# Patient Record
Sex: Male | Born: 1955
Health system: Southern US, Community
[De-identification: ages and names within clinical notes are randomized; demographics above are authoritative.]

## PROBLEM LIST (undated history)

## (undated) DIAGNOSIS — C61 Malignant neoplasm of prostate: Secondary | ICD-10-CM

## (undated) DIAGNOSIS — K219 Gastro-esophageal reflux disease without esophagitis: Secondary | ICD-10-CM

## (undated) DIAGNOSIS — E7800 Pure hypercholesterolemia, unspecified: Secondary | ICD-10-CM

## (undated) DIAGNOSIS — F419 Anxiety disorder, unspecified: Secondary | ICD-10-CM

## (undated) DIAGNOSIS — I4510 Unspecified right bundle-branch block: Secondary | ICD-10-CM

## (undated) DIAGNOSIS — F32A Depression, unspecified: Secondary | ICD-10-CM

## (undated) DIAGNOSIS — F329 Major depressive disorder, single episode, unspecified: Secondary | ICD-10-CM

## (undated) DIAGNOSIS — R7303 Prediabetes: Secondary | ICD-10-CM

## (undated) HISTORY — PX: OTHER SURGICAL HISTORY: SHX169

## (undated) HISTORY — PX: TONSILLECTOMY: SUR1361

## (undated) HISTORY — PX: COLONOSCOPY: SHX174

## (undated) HISTORY — PX: KNEE SURGERY: SHX244

## (undated) HISTORY — PX: APPENDECTOMY: SHX54

---

## 2002-05-21 ENCOUNTER — Ambulatory Visit (HOSPITAL_COMMUNITY): Admission: RE | Admit: 2002-05-21 | Discharge: 2002-05-21 | Payer: Self-pay | Admitting: Gastroenterology

## 2004-03-09 ENCOUNTER — Encounter: Admission: RE | Admit: 2004-03-09 | Discharge: 2004-03-09 | Payer: Self-pay | Admitting: Internal Medicine

## 2010-11-08 ENCOUNTER — Encounter: Payer: Self-pay | Admitting: Otolaryngology

## 2014-03-12 ENCOUNTER — Encounter (HOSPITAL_COMMUNITY): Payer: Self-pay

## 2014-07-23 ENCOUNTER — Other Ambulatory Visit: Payer: Self-pay | Admitting: Internal Medicine

## 2014-07-23 ENCOUNTER — Ambulatory Visit
Admission: RE | Admit: 2014-07-23 | Discharge: 2014-07-23 | Disposition: A | Discharge status: Home / Self Care | Payer: 59 | Source: Ambulatory Visit | Attending: Internal Medicine | Admitting: Internal Medicine

## 2014-07-23 DIAGNOSIS — R51 Headache: Principal | ICD-10-CM

## 2014-07-23 DIAGNOSIS — R519 Headache, unspecified: Secondary | ICD-10-CM

## 2016-04-12 DIAGNOSIS — R7309 Other abnormal glucose: Secondary | ICD-10-CM | POA: Diagnosis not present

## 2016-04-12 DIAGNOSIS — F322 Major depressive disorder, single episode, severe without psychotic features: Secondary | ICD-10-CM | POA: Diagnosis not present

## 2016-04-12 DIAGNOSIS — Z0000 Encounter for general adult medical examination without abnormal findings: Secondary | ICD-10-CM | POA: Diagnosis not present

## 2016-04-12 DIAGNOSIS — Z125 Encounter for screening for malignant neoplasm of prostate: Secondary | ICD-10-CM | POA: Diagnosis not present

## 2016-04-12 DIAGNOSIS — E7800 Pure hypercholesterolemia, unspecified: Secondary | ICD-10-CM | POA: Diagnosis not present

## 2016-04-26 DIAGNOSIS — R972 Elevated prostate specific antigen [PSA]: Secondary | ICD-10-CM | POA: Diagnosis not present

## 2017-04-13 DIAGNOSIS — R5383 Other fatigue: Secondary | ICD-10-CM | POA: Diagnosis not present

## 2017-04-13 DIAGNOSIS — F419 Anxiety disorder, unspecified: Secondary | ICD-10-CM | POA: Diagnosis not present

## 2017-04-13 DIAGNOSIS — Z125 Encounter for screening for malignant neoplasm of prostate: Secondary | ICD-10-CM | POA: Diagnosis not present

## 2017-04-13 DIAGNOSIS — R7309 Other abnormal glucose: Secondary | ICD-10-CM | POA: Diagnosis not present

## 2017-04-13 DIAGNOSIS — Z1159 Encounter for screening for other viral diseases: Secondary | ICD-10-CM | POA: Diagnosis not present

## 2017-04-13 DIAGNOSIS — E7800 Pure hypercholesterolemia, unspecified: Secondary | ICD-10-CM | POA: Diagnosis not present

## 2017-04-13 DIAGNOSIS — N529 Male erectile dysfunction, unspecified: Secondary | ICD-10-CM | POA: Diagnosis not present

## 2017-04-13 DIAGNOSIS — W57XXXA Bitten or stung by nonvenomous insect and other nonvenomous arthropods, initial encounter: Secondary | ICD-10-CM | POA: Diagnosis not present

## 2017-04-13 DIAGNOSIS — Z0000 Encounter for general adult medical examination without abnormal findings: Secondary | ICD-10-CM | POA: Diagnosis not present

## 2017-10-21 DIAGNOSIS — N529 Male erectile dysfunction, unspecified: Secondary | ICD-10-CM | POA: Diagnosis not present

## 2017-10-21 DIAGNOSIS — E7800 Pure hypercholesterolemia, unspecified: Secondary | ICD-10-CM | POA: Diagnosis not present

## 2017-10-21 DIAGNOSIS — F419 Anxiety disorder, unspecified: Secondary | ICD-10-CM | POA: Diagnosis not present

## 2017-10-21 DIAGNOSIS — R7309 Other abnormal glucose: Secondary | ICD-10-CM | POA: Diagnosis not present

## 2017-10-21 DIAGNOSIS — Z23 Encounter for immunization: Secondary | ICD-10-CM | POA: Diagnosis not present

## 2017-10-24 DIAGNOSIS — N400 Enlarged prostate without lower urinary tract symptoms: Secondary | ICD-10-CM | POA: Diagnosis not present

## 2017-10-24 DIAGNOSIS — E782 Mixed hyperlipidemia: Secondary | ICD-10-CM | POA: Diagnosis not present

## 2017-10-24 DIAGNOSIS — R7303 Prediabetes: Secondary | ICD-10-CM | POA: Diagnosis not present

## 2017-10-24 DIAGNOSIS — N529 Male erectile dysfunction, unspecified: Secondary | ICD-10-CM | POA: Diagnosis not present

## 2017-10-24 DIAGNOSIS — R972 Elevated prostate specific antigen [PSA]: Secondary | ICD-10-CM | POA: Diagnosis not present

## 2017-11-11 DIAGNOSIS — R972 Elevated prostate specific antigen [PSA]: Secondary | ICD-10-CM | POA: Diagnosis not present

## 2017-11-11 DIAGNOSIS — N5201 Erectile dysfunction due to arterial insufficiency: Secondary | ICD-10-CM | POA: Diagnosis not present

## 2017-12-16 HISTORY — PX: PROSTATE BIOPSY: SHX241

## 2017-12-19 DIAGNOSIS — R972 Elevated prostate specific antigen [PSA]: Secondary | ICD-10-CM | POA: Diagnosis not present

## 2018-01-02 ENCOUNTER — Encounter: Payer: Self-pay | Admitting: Radiation Oncology

## 2018-01-02 DIAGNOSIS — C61 Malignant neoplasm of prostate: Secondary | ICD-10-CM | POA: Diagnosis not present

## 2018-01-09 DIAGNOSIS — K429 Umbilical hernia without obstruction or gangrene: Secondary | ICD-10-CM | POA: Diagnosis not present

## 2018-01-09 DIAGNOSIS — C61 Malignant neoplasm of prostate: Secondary | ICD-10-CM | POA: Diagnosis not present

## 2018-01-30 ENCOUNTER — Ambulatory Visit: Payer: Self-pay

## 2018-01-30 ENCOUNTER — Ambulatory Visit: Payer: Self-pay | Admitting: Radiation Oncology

## 2018-02-02 DIAGNOSIS — C61 Malignant neoplasm of prostate: Secondary | ICD-10-CM | POA: Diagnosis not present

## 2018-02-02 DIAGNOSIS — R972 Elevated prostate specific antigen [PSA]: Secondary | ICD-10-CM | POA: Diagnosis not present

## 2018-02-14 ENCOUNTER — Encounter: Payer: Self-pay | Admitting: Radiation Oncology

## 2018-02-14 NOTE — Progress Notes (Signed)
GU Location of Tumor / Histology: prostatic adenocarcinoma  If Prostate Cancer, Gleason Score is (3 + 4) and PSA is (10) as of January 2019. Prostate volume: 38.01 grams. Reports the first time he was told his PSA was elevated was April 2018.  Kaleen Odea was referred to Dr. Lovena Neighbours by his PCP, PA Rolland Porter, in January 2019 for further evaluation of an elevated PSA.   Biopsies of prostate (if applicable) revealed:    Past/Anticipated interventions by urology, if any: prostate biopsy, discussed tx options, referred to radiation oncology  Past/Anticipated interventions by medical oncology, if any: no  Weight changes, if any: no  Bowel/Bladder complaints, if any: IPSS 11. Reports occasional frequency and urgency, nocturia x 1. Reports burning at the tip of his penis in the past. Denies dysuria, hematuria, leakage or incontinence.  Nausea/Vomiting, if any: no  Pain issues, if any:  no  SAFETY ISSUES:  Prior radiation? no  Pacemaker/ICD? no  Possible current pregnancy? no  Is the patient on methotrexate? no  Current Complaints / other details:  62 year old male. Married. NKDA. Father and paternal uncle with hx of unknown cancer. Resides in [place] by [employer]. Married for 40 plus years. No children just "furbabies.'

## 2018-02-16 ENCOUNTER — Telehealth: Payer: Self-pay | Admitting: Medical Oncology

## 2018-02-16 ENCOUNTER — Encounter: Payer: Self-pay | Admitting: Radiation Oncology

## 2018-02-16 ENCOUNTER — Encounter: Payer: Self-pay | Admitting: General Practice

## 2018-02-16 ENCOUNTER — Ambulatory Visit
Admission: RE | Admit: 2018-02-16 | Discharge: 2018-02-16 | Disposition: A | Discharge status: Home / Self Care | Payer: BLUE CROSS/BLUE SHIELD | Source: Ambulatory Visit | Attending: Radiation Oncology | Admitting: Radiation Oncology

## 2018-02-16 ENCOUNTER — Ambulatory Visit: Payer: BLUE CROSS/BLUE SHIELD | Admitting: Radiation Oncology

## 2018-02-16 ENCOUNTER — Other Ambulatory Visit: Payer: Self-pay

## 2018-02-16 VITALS — BP 135/97 | HR 69 | Temp 98.2°F | Resp 18 | Wt 255.6 lb

## 2018-02-16 DIAGNOSIS — R972 Elevated prostate specific antigen [PSA]: Secondary | ICD-10-CM | POA: Diagnosis not present

## 2018-02-16 DIAGNOSIS — K219 Gastro-esophageal reflux disease without esophagitis: Secondary | ICD-10-CM | POA: Diagnosis not present

## 2018-02-16 DIAGNOSIS — F418 Other specified anxiety disorders: Secondary | ICD-10-CM | POA: Diagnosis not present

## 2018-02-16 DIAGNOSIS — E7800 Pure hypercholesterolemia, unspecified: Secondary | ICD-10-CM | POA: Insufficient documentation

## 2018-02-16 DIAGNOSIS — C61 Malignant neoplasm of prostate: Secondary | ICD-10-CM | POA: Diagnosis not present

## 2018-02-16 DIAGNOSIS — Z809 Family history of malignant neoplasm, unspecified: Secondary | ICD-10-CM | POA: Insufficient documentation

## 2018-02-16 HISTORY — DX: Major depressive disorder, single episode, unspecified: F32.9

## 2018-02-16 HISTORY — DX: Gastro-esophageal reflux disease without esophagitis: K21.9

## 2018-02-16 HISTORY — DX: Malignant neoplasm of prostate: C61

## 2018-02-16 HISTORY — DX: Anxiety disorder, unspecified: F41.9

## 2018-02-16 HISTORY — DX: Pure hypercholesterolemia, unspecified: E78.00

## 2018-02-16 HISTORY — DX: Depression, unspecified: F32.A

## 2018-02-16 NOTE — Progress Notes (Signed)
See progress note under physician encounter. 

## 2018-02-16 NOTE — Progress Notes (Signed)
[facility] Psychosocial Distress Screening Clinical Social Work  Clinical Social Work was referred by distress screening protocol.  The patient scored a 7 on the Psychosocial Distress Thermometer which indicates moderate distress. Clinical Social Worker Edwyna Shell to assess for distress and other psychosocial needs. CSW and patient discussed common feeling and emotions when being diagnosed with cancer, and the importance of support during treatment. CSW informed patient of the support team and support services at [facility]. CSW provided contact information and encouraged patient to call with any questions or concerns.  Is pleased w finding out that cancer is "controllable, treatable."  "I am quite fortunate."  Had significant anxiety initially.  Would like to speak w dietician, staff message sent to [facility].    ONCBCN DISTRESS SCREENING 02/16/2018  Screening Type Initial Screening  Distress experienced in past week (1-10) 7  Practical problem type Insurance;Work/school  Emotional problem type Depression;Nervousness/Anxiety;Adjusting to illness  Information Concerns Type Lack of info about treatment  Physical Problem type Tingling hands/feet;Skin dry/itchy  Physician notified of physical symptoms Yes  Referral to clinical psychology No  Referral to clinical social work Yes  Referral to dietition No  Referral to financial advocate No  Referral to support programs Yes  Referral to palliative care No  Other can be reached on his cell at [phone]    Clinical Social Worker follow up needed: No.  If yes, follow up plan:  Edwyna Shell, LCSW Clinical Social Worker Phone:  [phone]

## 2018-02-16 NOTE — Progress Notes (Signed)
Radiation Oncology         [phone]) [phone] ________________________________  Initial outpatient Consultation  Name: Randy Alvarez MRN: [id number]  Date: 02/16/2018  DOB: [birth year]  MD:YJWLKHV, Loma Sousa, PA-C  Winter, Christopher Aar*   REFERRING PHYSICIAN: Davis Gourd*  DIAGNOSIS: 62 y.o. gentleman with Stage T1c adenocarcinoma of the prostate with Gleason Score of 3+4, and PSA of 10    ICD-10-CM   1. Malignant neoplasm of prostate [facility]) [facility] referral to Social Work    HISTORY OF PRESENT ILLNESS: Randy Alvarez is a 62 y.o. male with a diagnosis of prostate cancer. He was noted to have an elevated PSA of 10.0 by his primary care provider, Carlyon Prows, PA-C.  Previous PSA had been elevated at 7.7 in 03/2017 but he had not had elevated PSA prior to that to his knowledge.  Accordingly, he was referred for evaluation in urology by Dr. Lovena Neighbours on 11/11/17,  digital rectal examination was performed at that time revealing symmetric lobes without discrete nodularity.  The patient proceeded to transrectal ultrasound with 12 biopsies of the prostate on 12/19/17.  The prostate volume measured 38.01 cc.  Out of 12 core biopsies,3 were positive.  The maximum Gleason score was 3+4, and this was seen in right mid lateral.  Additionally, there was Gleason 3+3 disease in the right mid and left base lateral.  A CT abdomen and pelvis was performed on 01/09/2018 for disease staging and was negative for any evidence of metastatic disease in the abdomen or pelvis.  The patient reviewed the biopsy results with his urologist and he has kindly been referred today for discussion of potential radiation treatment options.   PREVIOUS RADIATION THERAPY: No  PAST MEDICAL HISTORY:  Past Medical History:  Diagnosis Date  . Anxiety   . Depression   . GERD (gastroesophageal reflux disease)   . Hypercholesteremia   . Prostate cancer ([place])       PAST SURGICAL HISTORY: Past Surgical History:    Procedure Laterality Date  . APPENDECTOMY    . descended testicle     . KNEE SURGERY Right   . nose operation    . nose operation    . PROSTATE BIOPSY      FAMILY HISTORY:  Family History  Problem Relation Age of Onset  . Cancer Father   . Cancer Paternal Uncle     SOCIAL HISTORY:  Social History   Socioeconomic History  . Marital status: Married    Spouse name: Not on file  . Number of children: Not on file  . Years of education: Not on file  . Highest education level: Not on file  Occupational History  . Not on file  Social Needs  . Financial resource strain: Not on file  . Food insecurity:    Worry: Not on file    Inability: Not on file  . Transportation needs:    Medical: Not on file    Non-medical: Not on file  Tobacco Use  . Smoking status: Never Smoker  . Smokeless tobacco: Never Used  Substance and Sexual Activity  . Alcohol use: Yes  . Drug use: Never  . Sexual activity: Yes  Lifestyle  . Physical activity:    Days per week: Not on file    Minutes per session: Not on file  . Stress: Not on file  Relationships  . Social connections:    Talks on phone: Not on file    Gets together: Not on file  Attends religious service: Not on file    Active member of club or organization: Not on file    Attends meetings of clubs or organizations: Not on file    Relationship status: Not on file  . Intimate partner violence:    Fear of current or ex partner: Not on file    Emotionally abused: Not on file    Physically abused: Not on file    Forced sexual activity: Not on file  Other Topics Concern  . Not on file  Social History Narrative  . Not on file    ALLERGIES: Patient has no known allergies.  MEDICATIONS:  Current Outpatient Medications  Medication Sig Dispense Refill  . sildenafil (REVATIO) 20 MG tablet Take 20 mg by mouth 3 (three) times daily.    Marland Kitchen aspirin EC 81 MG tablet Take 81 mg by mouth daily.     No current facility-administered  medications for this encounter.     REVIEW OF SYSTEMS:  On review of systems, the patient reports that he is doing well overall.  He denies any chest pain, shortness of breath, cough, fevers, chills, night sweats, unintended weight changes.  He denies any bowel disturbances, and denies abdominal pain, nausea or vomiting.  He denies any new musculoskeletal or joint aches or pains. His IPSS was 11, indicating mild to moderate urinary symptoms. He is able to complete sexual activity with most attempts. A complete review of systems is obtained and is otherwise negative.    PHYSICAL EXAM:  Wt Readings from Last 3 Encounters:  02/16/18 255 lb 9.6 oz (115.9 kg)   Temp Readings from Last 3 Encounters:  02/16/18 98.2 F (36.8 C) (Oral)   BP Readings from Last 3 Encounters:  02/16/18 (!) 135/97   Pulse Readings from Last 3 Encounters:  02/16/18 69   Pain Assessment Pain Score: 0-No pain/10  In general this is a well appearing Caucasian male in no acute distress.  He is alert and oriented x4 and appropriate throughout the examination. HEENT reveals that the patient is normocephalic, atraumatic. EOMs are intact. PERRLA. Skin is intact without any evidence of gross lesions. Cardiovascular exam reveals a regular rate and rhythm, no clicks rubs or murmurs are auscultated. Chest is clear to auscultation bilaterally. Lymphatic assessment is performed and does not reveal any adenopathy in the cervical, supraclavicular, axillary, or inguinal chains. Abdomen has active bowel sounds in all quadrants and is intact. The abdomen is soft, non tender, non distended. Lower extremities are negative for pretibial pitting edema, deep calf tenderness, cyanosis or clubbing.   KPS = 100  100 - Normal; no complaints; no evidence of disease. 90   - Able to carry on normal activity; minor signs or symptoms of disease. 80   - Normal activity with effort; some signs or symptoms of disease. 75   - Cares for self; unable to  carry on normal activity or to do active work. 60   - Requires occasional assistance, but is able to care for most of his personal needs. 50   - Requires considerable assistance and frequent medical care. 42   - Disabled; requires special care and assistance. 22   - Severely disabled; hospital admission is indicated although death not imminent. 76   - Very sick; hospital admission necessary; active supportive treatment necessary. 10   - Moribund; fatal processes progressing rapidly. 0     - Dead  Karnofsky DA, Abelmann WH, Craver LS and Burchenal [facility] [phone]) The use of the  nitrogen mustards in the palliative treatment of carcinoma: with particular reference to bronchogenic carcinoma Cancer 1 [phone]  LABORATORY DATA:  No results found for: WBC, HGB, HCT, MCV, PLT No results found for: NA, K, CL, CO2 No results found for: ALT, AST, GGT, ALKPHOS, BILITOT   RADIOGRAPHY: No results found.    IMPRESSION/PLAN: 1. 62 y.o. gentleman with Stage T1c adenocarcinoma of the prostate with Gleason Score of 3+4, and PSA of 10. We discussed the patient's workup and outlined the nature of prostate cancer in this setting. The patient's T stage, Gleason's score, and PSA put him into the favorable intermediate risk group. Accordingly, he is eligible for a variety of potential treatment options including brachytherapy or 5-8 weeks of external beam radiation. We discussed the available radiation techniques, and focused on the details and logistics and delivery.  We discussed and outlined the risks, benefits, short and long-term effects associated with radiotherapy and compared and contrasted these with prostatectomy. We also discussed the role of SpaceOAR in reducing the rectal toxicity associated with radiotherapy.   At the conclusion of our conversation, the patient is interested in moving forward with brachytherapy and use of SpaceOAR to reduce rectal toxicity from radiotherapy.  He would like to have his treatment  in mid-June or after to allow for a summer vacation around [place].  We will share our discussion with Dr. Lovena Neighbours and move forward with scheduling his CT [facility] planning appointment in the near future.  The patient met briefly with Romie Jumper in our office who will be working closely with him to coordinate OR scheduling and pre and post procedure appointments.  We will contact the pharmaceutical rep to ensure that [place] is available at the time of procedure.  He will have a prostate MRI following his post-seed CT SIM to confirm appropriate distribution of the [place].   Nicholos Johns, PA-C    Tyler Pita, MD  [facility] Direct Dial: [phone]  Fax: [phone] [email or web address]  Skype  LinkedIn

## 2018-02-16 NOTE — Telephone Encounter (Signed)
Called Randy Alvarez to introduced myself as the prostate nurse navigator and my role. He consulted with Dr. Tammi Klippel this morning and I was unable to meet him. He states the visit went very well and he has decided on brachytherapy for treatment. He did meet with Enid Derry and is aware she will be in contact to schedule. He currently does not have questions but I did give him my office number and asked him to call with questions or concerns. He voiced understanding.

## 2018-02-20 ENCOUNTER — Telehealth: Payer: Self-pay | Admitting: Medical Oncology

## 2018-02-20 ENCOUNTER — Other Ambulatory Visit: Payer: Self-pay | Admitting: Medical Oncology

## 2018-02-20 DIAGNOSIS — C61 Malignant neoplasm of prostate: Secondary | ICD-10-CM

## 2018-02-20 NOTE — Telephone Encounter (Signed)
Per Edwyna Shell, SW, Mr. Randy Alvarez is interested in meeting with dietician to lose weight and eat healthier. Per Dory Peru he needs to be referred to [facility]. I called Mr. Randy Alvarez to confirm this is his request. He states that he has struggled with losing weight and feels this referral will help to motivate him. Referral placed.

## 2018-02-21 ENCOUNTER — Telehealth: Payer: Self-pay | Admitting: *Deleted

## 2018-02-21 NOTE — Telephone Encounter (Signed)
CALLED PATIENT TO INFORM OF PRE-SEED PLANNING CT FOR 03-03-18 - ARRIVAL TIME - 1:15 PM, SPOKE WITH PATIENT AND HE IS AWARE OF THIS APPT.

## 2018-02-24 DIAGNOSIS — C61 Malignant neoplasm of prostate: Secondary | ICD-10-CM | POA: Diagnosis not present

## 2018-03-01 ENCOUNTER — Institutional Professional Consult (permissible substitution): Payer: Self-pay | Admitting: Radiation Oncology

## 2018-03-02 ENCOUNTER — Telehealth: Payer: Self-pay | Admitting: *Deleted

## 2018-03-02 NOTE — Telephone Encounter (Signed)
CALLED PATIENT TO REMIND OF PRE-SEED APPTS. FOR 03-03-18, LVM FOR A RETURN CALL 

## 2018-03-03 ENCOUNTER — Ambulatory Visit
Admission: RE | Admit: 2018-03-03 | Discharge: 2018-03-03 | Disposition: A | Discharge status: Home / Self Care | Payer: BLUE CROSS/BLUE SHIELD | Source: Ambulatory Visit | Attending: Radiation Oncology | Admitting: Radiation Oncology

## 2018-03-03 ENCOUNTER — Other Ambulatory Visit: Payer: Self-pay | Admitting: Urology

## 2018-03-03 DIAGNOSIS — C61 Malignant neoplasm of prostate: Secondary | ICD-10-CM

## 2018-03-03 DIAGNOSIS — Z510 Encounter for antineoplastic radiation therapy: Secondary | ICD-10-CM | POA: Insufficient documentation

## 2018-03-03 NOTE — Progress Notes (Signed)
  Radiation Oncology         [phone]) [phone] ________________________________  Name: Randy Alvarez MRN: [id number]  Date: 03/03/2018  DOB: 06-Sep-1956  SIMULATION AND TREATMENT PLANNING NOTE PUBIC ARCH STUDY  FB:XUXYBFX, Myrtha Mantis  Winter, Christopher Aar*  DIAGNOSIS: 62 y.o. male with Stage T1c adenocarcinoma of the prostate with Gleason Score of 3+4, and PSA of 10     ICD-10-CM   1. Malignant neoplasm of prostate ([place]) C61     COMPLEX SIMULATION:  The patient presented today for evaluation for possible prostate seed implant. He was brought to the radiation planning suite and placed supine on the CT couch. A 3-dimensional image study set was obtained in upload to the planning computer. There, on each axial slice, I contoured the prostate gland. Then, using three-dimensional radiation planning tools I reconstructed the prostate in view of the structures from the transperineal needle pathway to assess for possible pubic arch interference. In doing so, I did not appreciate any pubic arch interference. Also, the patient's prostate volume was estimated based on the drawn structure. The volume was 46 cc.  Given the pubic arch appearance and prostate volume, patient remains a good candidate to proceed with prostate seed implant. Today, he freely provided informed written consent to proceed.    PLAN: The patient will undergo prostate seed implant.   ________________________________  Sheral Apley. Tammi Klippel, M.D.  This document serves as a record of services personally performed by Tyler Pita, MD. It was created on his behalf by Rae Lips, a trained medical scribe. The creation of this record is based on the scribe's personal observations and the provider's statements to them. This document has been checked and approved by the attending provider.

## 2018-03-06 ENCOUNTER — Encounter (HOSPITAL_COMMUNITY)
Admission: RE | Admit: 2018-03-06 | Discharge: 2018-03-06 | Disposition: A | Discharge status: Home / Self Care | Payer: BLUE CROSS/BLUE SHIELD | Source: Ambulatory Visit | Attending: Urology | Admitting: Urology

## 2018-03-06 ENCOUNTER — Ambulatory Visit (HOSPITAL_COMMUNITY)
Admission: RE | Admit: 2018-03-06 | Discharge: 2018-03-06 | Disposition: A | Discharge status: Home / Self Care | Payer: BLUE CROSS/BLUE SHIELD | Source: Ambulatory Visit | Attending: Urology | Admitting: Urology

## 2018-03-06 DIAGNOSIS — I10 Essential (primary) hypertension: Secondary | ICD-10-CM | POA: Diagnosis not present

## 2018-03-06 DIAGNOSIS — Z01818 Encounter for other preprocedural examination: Secondary | ICD-10-CM

## 2018-03-06 DIAGNOSIS — I4510 Unspecified right bundle-branch block: Secondary | ICD-10-CM | POA: Diagnosis not present

## 2018-03-06 DIAGNOSIS — I493 Ventricular premature depolarization: Secondary | ICD-10-CM | POA: Insufficient documentation

## 2018-03-06 DIAGNOSIS — Z01810 Encounter for preprocedural cardiovascular examination: Secondary | ICD-10-CM | POA: Insufficient documentation

## 2018-03-17 ENCOUNTER — Ambulatory Visit: Payer: BLUE CROSS/BLUE SHIELD | Admitting: Dietician

## 2018-03-17 ENCOUNTER — Other Ambulatory Visit: Payer: Self-pay | Admitting: Urology

## 2018-03-17 DIAGNOSIS — C61 Malignant neoplasm of prostate: Secondary | ICD-10-CM

## 2018-03-23 DIAGNOSIS — L723 Sebaceous cyst: Secondary | ICD-10-CM | POA: Diagnosis not present

## 2018-04-06 ENCOUNTER — Telehealth: Payer: Self-pay | Admitting: *Deleted

## 2018-04-06 NOTE — Telephone Encounter (Signed)
Called patient to remind of lab appt. For 04-07-18- arrival time- 1:45 pm @ [facility] Admitting, spoke with patient and he is aware of this appt.

## 2018-04-07 ENCOUNTER — Encounter (HOSPITAL_COMMUNITY)
Admission: RE | Admit: 2018-04-07 | Discharge: 2018-04-07 | Disposition: A | Discharge status: Home / Self Care | Payer: BLUE CROSS/BLUE SHIELD | Source: Ambulatory Visit | Attending: Urology | Admitting: Urology

## 2018-04-07 DIAGNOSIS — Z01812 Encounter for preprocedural laboratory examination: Secondary | ICD-10-CM | POA: Diagnosis not present

## 2018-04-07 LAB — COMPREHENSIVE METABOLIC PANEL
ALT: 32 U/L (ref 17–63)
AST: 31 U/L (ref 15–41)
Albumin: 3.7 g/dL (ref 3.5–5.0)
Alkaline Phosphatase: 54 U/L (ref 38–126)
Anion gap: 8 (ref 5–15)
BUN: 16 mg/dL (ref 6–20)
CO2: 25 mmol/L (ref 22–32)
Calcium: 9.2 mg/dL (ref 8.9–10.3)
Chloride: 106 mmol/L (ref 101–111)
Creatinine, Ser: 1.05 mg/dL (ref 0.61–1.24)
GFR calc Af Amer: >60 mL/min (ref >60)
GFR calc non Af Amer: >60 mL/min (ref >60)
Glucose, Bld: 117 mg/dL — ABNORMAL HIGH (ref 65–99)
Potassium: 4.3 mmol/L (ref 3.5–5.1)
Sodium: 139 mmol/L (ref 135–145)
Total Bilirubin: 0.9 mg/dL (ref 0.3–1.2)
Total Protein: 7.1 g/dL (ref 6.5–8.1)

## 2018-04-07 LAB — CBC
HCT: 44.4 % (ref 39.0–52.0)
Hemoglobin: 15 g/dL (ref 13.0–17.0)
MCH: 31.1 pg (ref 26.0–34.0)
MCHC: 33.8 g/dL (ref 30.0–36.0)
MCV: 91.9 fL (ref 78.0–100.0)
Platelets: 204 10*3/uL (ref 150–400)
RBC: 4.83 MIL/uL (ref 4.22–5.81)
RDW: 13.1 % (ref 11.5–15.5)
WBC: 7.2 10*3/uL (ref 4.0–10.5)

## 2018-04-07 LAB — APTT: aPTT: 27 seconds (ref 24–36)

## 2018-04-07 LAB — PROTIME-INR
INR: 1.1
Prothrombin Time: 14.1 seconds (ref 11.4–15.2)

## 2018-04-10 ENCOUNTER — Other Ambulatory Visit: Payer: Self-pay

## 2018-04-10 ENCOUNTER — Encounter (HOSPITAL_BASED_OUTPATIENT_CLINIC_OR_DEPARTMENT_OTHER): Payer: Self-pay

## 2018-04-10 NOTE — Progress Notes (Signed)
Spoke with:  Legrand Como NPO:  No food after midnight/Clear liquids until 7:00AM DOS Arrival time: 11:00AM Labs:  (EKG/CXR 03/06/2018, CBC, CMP, PT, PTT 04/07/2018 in epic) AM medications:  Fleet enema Pre op orders: yes Ride home:  Mardene Celeste (wife) [phone]

## 2018-04-10 NOTE — Pre-Procedure Instructions (Signed)
CMP results 04/07/2018 faxed to Dr. Lovena Neighbours via epic.

## 2018-04-13 ENCOUNTER — Telehealth: Payer: Self-pay | Admitting: *Deleted

## 2018-04-13 NOTE — Telephone Encounter (Signed)
CALLED PATIENT TO REMIND OF PROCEDURE FOR 04-14-18, SPOKE WITH PATIENT AND HE IS AWARE OF THIS PROCEDURE

## 2018-04-14 ENCOUNTER — Encounter (HOSPITAL_BASED_OUTPATIENT_CLINIC_OR_DEPARTMENT_OTHER): Payer: Self-pay | Admitting: Anesthesiology

## 2018-04-14 ENCOUNTER — Encounter (HOSPITAL_BASED_OUTPATIENT_CLINIC_OR_DEPARTMENT_OTHER)
Admission: RE | Disposition: A | Discharge status: Home / Self Care | Payer: Self-pay | Source: Ambulatory Visit | Attending: Urology

## 2018-04-14 ENCOUNTER — Ambulatory Visit (HOSPITAL_BASED_OUTPATIENT_CLINIC_OR_DEPARTMENT_OTHER): Payer: BLUE CROSS/BLUE SHIELD | Admitting: Anesthesiology

## 2018-04-14 ENCOUNTER — Ambulatory Visit (HOSPITAL_COMMUNITY): Payer: BLUE CROSS/BLUE SHIELD

## 2018-04-14 ENCOUNTER — Other Ambulatory Visit: Payer: Self-pay

## 2018-04-14 ENCOUNTER — Ambulatory Visit (HOSPITAL_BASED_OUTPATIENT_CLINIC_OR_DEPARTMENT_OTHER)
Admission: RE | Admit: 2018-04-14 | Discharge: 2018-04-14 | Disposition: A | Discharge status: Home / Self Care | Payer: BLUE CROSS/BLUE SHIELD | Source: Ambulatory Visit | Attending: Urology | Admitting: Urology

## 2018-04-14 DIAGNOSIS — C61 Malignant neoplasm of prostate: Secondary | ICD-10-CM | POA: Diagnosis not present

## 2018-04-14 DIAGNOSIS — Z885 Allergy status to narcotic agent status: Secondary | ICD-10-CM | POA: Insufficient documentation

## 2018-04-14 DIAGNOSIS — Z7982 Long term (current) use of aspirin: Secondary | ICD-10-CM | POA: Insufficient documentation

## 2018-04-14 DIAGNOSIS — Z888 Allergy status to other drugs, medicaments and biological substances status: Secondary | ICD-10-CM | POA: Insufficient documentation

## 2018-04-14 DIAGNOSIS — Z79899 Other long term (current) drug therapy: Secondary | ICD-10-CM | POA: Diagnosis not present

## 2018-04-14 HISTORY — DX: Unspecified right bundle-branch block: I45.10

## 2018-04-14 HISTORY — DX: Prediabetes: R73.03

## 2018-04-14 HISTORY — PX: RADIOACTIVE SEED IMPLANT: SHX5150

## 2018-04-14 HISTORY — PX: SPACE OAR INSTILLATION: SHX6769

## 2018-04-14 LAB — POCT I-STAT 4, (NA,K, GLUC, HGB,HCT)
Glucose, Bld: 124 mg/dL — ABNORMAL HIGH (ref 70–99)
HCT: 46 % (ref 39.0–52.0)
Hemoglobin: 15.6 g/dL (ref 13.0–17.0)
Potassium: 4.2 mmol/L (ref 3.5–5.1)
Sodium: 140 mmol/L (ref 135–145)

## 2018-04-14 SURGERY — INSERTION, RADIATION SOURCE, PROSTATE
Anesthesia: General | Site: Prostate

## 2018-04-14 MED ORDER — ACETAMINOPHEN 325 MG PO TABS
ORAL_TABLET | ORAL | Status: DC | PRN
  Administered 2018-04-14: 1000 mg via ORAL

## 2018-04-14 MED ORDER — PHENAZOPYRIDINE HCL 200 MG PO TABS
200.0000 mg | ORAL_TABLET | Freq: Once | ORAL | Status: AC
  Administered 2018-04-14: 200 mg via ORAL
  Filled 2018-04-14: qty 1

## 2018-04-14 MED ORDER — EPHEDRINE SULFATE-NACL 50-0.9 MG/10ML-% IV SOSY
PREFILLED_SYRINGE | INTRAVENOUS | Status: DC | PRN
  Administered 2018-04-14 (×4): 10 mg via INTRAVENOUS

## 2018-04-14 MED ORDER — PROPOFOL 10 MG/ML IV BOLUS
INTRAVENOUS | Status: AC
  Filled 2018-04-14: qty 40

## 2018-04-14 MED ORDER — DEXAMETHASONE SODIUM PHOSPHATE 10 MG/ML IJ SOLN
INTRAMUSCULAR | Status: AC
  Filled 2018-04-14: qty 1

## 2018-04-14 MED ORDER — LIDOCAINE 2% (20 MG/ML) 5 ML SYRINGE
INTRAMUSCULAR | Status: AC
  Filled 2018-04-14: qty 5

## 2018-04-14 MED ORDER — SODIUM CHLORIDE 0.9 % IJ SOLN
INTRAMUSCULAR | Status: DC | PRN
  Administered 2018-04-14: 10 mL

## 2018-04-14 MED ORDER — HYDROMORPHONE HCL 1 MG/ML IJ SOLN
0.2500 mg | INTRAMUSCULAR | Status: DC | PRN
  Filled 2018-04-14: qty 0.5

## 2018-04-14 MED ORDER — FENTANYL CITRATE (PF) 100 MCG/2ML IJ SOLN
INTRAMUSCULAR | Status: DC | PRN
  Administered 2018-04-14: 25 ug via INTRAVENOUS
  Administered 2018-04-14: 50 ug via INTRAVENOUS
  Administered 2018-04-14: 25 ug via INTRAVENOUS

## 2018-04-14 MED ORDER — FENTANYL CITRATE (PF) 100 MCG/2ML IJ SOLN
INTRAMUSCULAR | Status: AC
  Filled 2018-04-14: qty 2

## 2018-04-14 MED ORDER — LACTATED RINGERS IV SOLN
INTRAVENOUS | Status: DC
  Administered 2018-04-14 (×3): via INTRAVENOUS
  Filled 2018-04-14: qty 1000

## 2018-04-14 MED ORDER — PHENAZOPYRIDINE HCL 200 MG PO TABS: 200.0000 mg | ORAL_TABLET | Freq: Three times a day (TID) | ORAL | 0 refills | Status: AC | PRN

## 2018-04-14 MED ORDER — PHENAZOPYRIDINE HCL 100 MG PO TABS
ORAL_TABLET | ORAL | Status: AC
Start: 2018-04-14 — End: ?
  Filled 2018-04-14: qty 2

## 2018-04-14 MED ORDER — PROMETHAZINE HCL 25 MG/ML IJ SOLN
6.2500 mg | INTRAMUSCULAR | Status: DC | PRN
  Filled 2018-04-14: qty 1

## 2018-04-14 MED ORDER — ONDANSETRON HCL 4 MG/2ML IJ SOLN
INTRAMUSCULAR | Status: AC
  Filled 2018-04-14: qty 2

## 2018-04-14 MED ORDER — DEXAMETHASONE SODIUM PHOSPHATE 4 MG/ML IJ SOLN
INTRAMUSCULAR | Status: DC | PRN
  Administered 2018-04-14: 10 mg via INTRAVENOUS

## 2018-04-14 MED ORDER — TRAMADOL HCL 50 MG PO TABS: 50.0000 mg | ORAL_TABLET | Freq: Four times a day (QID) | ORAL | 0 refills | Status: AC | PRN

## 2018-04-14 MED ORDER — PROPOFOL 10 MG/ML IV BOLUS
INTRAVENOUS | Status: DC | PRN
  Administered 2018-04-14: 200 mg via INTRAVENOUS
  Administered 2018-04-14: 20 mg via INTRAVENOUS

## 2018-04-14 MED ORDER — MIDAZOLAM HCL 5 MG/5ML IJ SOLN
INTRAMUSCULAR | Status: DC | PRN
  Administered 2018-04-14: 2 mg via INTRAVENOUS

## 2018-04-14 MED ORDER — IOHEXOL 300 MG/ML  SOLN
INTRAMUSCULAR | Status: DC | PRN
  Administered 2018-04-14: 7 mL

## 2018-04-14 MED ORDER — ONDANSETRON HCL 4 MG/2ML IJ SOLN
INTRAMUSCULAR | Status: DC | PRN
  Administered 2018-04-14: 4 mg via INTRAVENOUS

## 2018-04-14 MED ORDER — FLEET ENEMA 7-19 GM/118ML RE ENEM
1.0000 | ENEMA | Freq: Once | RECTAL | Status: DC
  Filled 2018-04-14: qty 1

## 2018-04-14 MED ORDER — CIPROFLOXACIN IN D5W 400 MG/200ML IV SOLN
400.0000 mg | INTRAVENOUS | Status: AC
  Administered 2018-04-14: 400 mg via INTRAVENOUS
  Filled 2018-04-14: qty 200

## 2018-04-14 MED ORDER — MIDAZOLAM HCL 2 MG/2ML IJ SOLN
INTRAMUSCULAR | Status: AC
  Filled 2018-04-14: qty 2

## 2018-04-14 MED ORDER — SODIUM CHLORIDE 0.9 % IR SOLN
Status: DC | PRN
  Administered 2018-04-14: 1000 mL via INTRAVESICAL

## 2018-04-14 MED ORDER — CIPROFLOXACIN IN D5W 400 MG/200ML IV SOLN
INTRAVENOUS | Status: AC
Start: 2018-04-14 — End: ?
  Filled 2018-04-14: qty 200

## 2018-04-14 MED ORDER — SULFAMETHOXAZOLE-TRIMETHOPRIM 800-160 MG PO TABS: 1.0000 | ORAL_TABLET | Freq: Two times a day (BID) | ORAL | 0 refills | Status: AC

## 2018-04-14 MED ORDER — LIDOCAINE 2% (20 MG/ML) 5 ML SYRINGE
INTRAMUSCULAR | Status: DC | PRN
  Administered 2018-04-14: 100 mg via INTRAVENOUS

## 2018-04-14 MED ORDER — ACETAMINOPHEN 500 MG PO TABS
ORAL_TABLET | ORAL | Status: AC
  Filled 2018-04-14: qty 2

## 2018-04-14 MED FILL — traMADol HCL 50 MG TABS: 50 | 5 days supply | Qty: 20 | Fill #0

## 2018-04-14 MED FILL — SULFAMETHOXAZOLE-TMP DS TAB: 800-160 | 3 days supply | Qty: 6 | Fill #0

## 2018-04-14 SURGICAL SUPPLY — 37 items
BAG URINE DRAINAGE (UROLOGICAL SUPPLIES) ×2 IMPLANT
BLADE CLIPPER SURG (BLADE) ×2 IMPLANT
CATH FOLEY 2WAY SLVR  5CC 16FR (CATHETERS) ×1
CATH FOLEY 2WAY SLVR 5CC 16FR (CATHETERS) ×1 IMPLANT
CATH ROBINSON RED A/P 16FR (CATHETERS) IMPLANT
CATH ROBINSON RED A/P 20FR (CATHETERS) ×2 IMPLANT
CLOTH BEACON ORANGE TIMEOUT ST (SAFETY) ×2 IMPLANT
CONT SPECI 4OZ STER CLIK (MISCELLANEOUS) ×4 IMPLANT
COVER BACK TABLE 60X90IN (DRAPES) ×2 IMPLANT
COVER MAYO STAND STRL (DRAPES) ×2 IMPLANT
DRSG TEGADERM 4X4.75 (GAUZE/BANDAGES/DRESSINGS) ×3 IMPLANT
DRSG TEGADERM 8X12 (GAUZE/BANDAGES/DRESSINGS) ×4 IMPLANT
GAUZE SPONGE 4X4 12PLY STRL LF (GAUZE/BANDAGES/DRESSINGS) ×1 IMPLANT
GLOVE BIO SURGEON STRL SZ 6 (GLOVE) IMPLANT
GLOVE BIO SURGEON STRL SZ7 (GLOVE) IMPLANT
GLOVE BIO SURGEON STRL SZ7.5 (GLOVE) ×2 IMPLANT
GLOVE BIO SURGEON STRL SZ8 (GLOVE) IMPLANT
GLOVE BIOGEL PI IND STRL 6 (GLOVE) IMPLANT
GLOVE BIOGEL PI IND STRL 8 (GLOVE) IMPLANT
GLOVE BIOGEL PI INDICATOR 6 (GLOVE)
GLOVE BIOGEL PI INDICATOR 8 (GLOVE)
GLOVE ECLIPSE 8.0 STRL XLNG CF (GLOVE) ×2 IMPLANT
GLOVE INDICATOR 7.0 STRL GRN (GLOVE) IMPLANT
GOWN STRL REUS W/TWL LRG LVL3 (GOWN DISPOSABLE) ×2 IMPLANT
GOWN STRL REUS W/TWL XL LVL3 (GOWN DISPOSABLE) ×2 IMPLANT
HOLDER FOLEY CATH W/STRAP (MISCELLANEOUS) IMPLANT
I-Seed AgX100 Iodine-125 Radionuclide Brachytherap ×1 IMPLANT
IMPL SPACEOAR SYSTEM 10ML (MISCELLANEOUS) ×1 IMPLANT
IMPLANT SPACEOAR SYSTEM 10ML (MISCELLANEOUS) ×2
IV SOD CHL 0.9% 1000ML (IV SOLUTION) ×2 IMPLANT
KIT TURNOVER CYSTO (KITS) ×2 IMPLANT
MARKER SKIN DUAL TIP RULER LAB (MISCELLANEOUS) ×2 IMPLANT
PACK CYSTO (CUSTOM PROCEDURE TRAY) ×2 IMPLANT
SURGILUBE 2OZ TUBE FLIPTOP (MISCELLANEOUS) ×2 IMPLANT
SYR 10ML LL (SYRINGE) ×2 IMPLANT
UNDERPAD 30X30 (UNDERPADS AND DIAPERS) ×4 IMPLANT
WATER STERILE IRR 500ML POUR (IV SOLUTION) ×2 IMPLANT

## 2018-04-14 NOTE — Anesthesia Postprocedure Evaluation (Signed)
Anesthesia Post Note  Patient: Randy Alvarez  Procedure(s) Performed: RADIOACTIVE SEED IMPLANT/BRACHYTHERAPY IMPLANT (N/A Prostate) SPACE OAR INSTILLATION (N/A Prostate)     Patient location during evaluation: PACU Anesthesia Type: General Level of consciousness: awake and alert Pain management: pain level controlled Vital Signs Assessment: post-procedure vital signs reviewed and stable Respiratory status: spontaneous breathing, nonlabored ventilation and respiratory function stable Cardiovascular status: blood pressure returned to baseline and stable Postop Assessment: no apparent nausea or vomiting Anesthetic complications: no    Last Vitals:  Vitals:   04/14/18 1515 04/14/18 1530  BP: (!) 141/95 136/82  Pulse: 84 82  Resp: 12 13  Temp:    SpO2: 96% 94%    Last Pain:  Vitals:   04/14/18 1515  TempSrc:   PainSc: 0-No pain                 Randy Alvarez

## 2018-04-14 NOTE — Anesthesia Procedure Notes (Signed)
Procedure Name: LMA Insertion Date/Time: 04/14/2018 1:22 PM Performed by: Lynda Rainwater, MD Pre-anesthesia Checklist: Patient identified, Emergency Drugs available, Suction available and Patient being monitored Patient Re-evaluated:Patient Re-evaluated prior to induction Oxygen Delivery Method: Circle system utilized Preoxygenation: Pre-oxygenation with 100% oxygen Induction Type: IV induction Ventilation: Mask ventilation without difficulty LMA: LMA inserted LMA Size: 5.0 Number of attempts: 1 Airway Equipment and Method: Bite block Placement Confirmation: positive ETCO2 Tube secured with: Tape Dental Injury: Teeth and Oropharynx as per pre-operative assessment

## 2018-04-14 NOTE — Discharge Instructions (Signed)

## 2018-04-14 NOTE — Op Note (Signed)
PATIENT:  Randy Alvarez  PRE-OPERATIVE DIAGNOSIS:  Adenocarcinoma of the prostate  POST-OPERATIVE DIAGNOSIS:  Same  PROCEDURE:  1. I-125 radioactive seed implantation 2. Cystoscopy  3. Placement of SpaceOAR  SURGEON:  Ellison Hughs, MD  Radiation oncologist: Tyler Pita, MD  ANESTHESIA:  General  EBL:  Minimal  DRAINS: None  INDICATION: Randy Alvarez  is a 62 y.o. male with newly diagnosed Gleason 3+4=7 prostate cancer that was discovered on PNBx from 12/19/17.   Description of procedure: After informed consent the patient was brought to the major OR, placed on the table and administered general anesthesia. He was then moved to the modified lithotomy position with his perineum perpendicular to the floor. His perineum and genitalia were then sterilely prepped. An official timeout was then performed. A 16 French Foley catheter was then placed in the bladder and filled with dilute contrast, a rectal tube was placed in the rectum and the transrectal ultrasound probe was placed in the rectum and affixed to the stand. He was then sterilely draped.  Real time ultrasonography was used along with the seed planning software On[employer] Prostate vs. 4.2.2.4. This was used to develop the seed plan including the number of needles as well as number of seeds required for complete and adequate coverage. Real-time ultrasonography was then used along with the previously developed plan and the Nucletron device to implant a total of 68 seeds using 22 needles. This proceeded without difficulty or complication.   I then proceeded with placement of SpaceOAR by introducing a needle with the bevel angled inferiorly approximately 2 cm superior to the anus. This was angled downward and under direct ultrasound was placed within the space between the prostatic capsule and rectum. This was confirmed with a small amount of sterile saline injected and this was performed under direct ultrasound. I then attached  the SpaceOAR to the needle and injected this in the space between the prostate and rectum with good placement noted.  A Foley catheter was then removed as well as the transrectal ultrasound probe and rectal probe. Flexible cystoscopy was then performed using the 17 French flexible scope which revealed a normal urethra throughout its length down to the sphincter which appeared intact. The prostatic urethra revealed bilobar hypertrophy but no evidence of obstruction, seeds, spacers or lesions. The bladder was then entered and fully and systematically inspected. The ureteral orifices were noted to be of normal configuration and position. The mucosa revealed no evidence of tumors. There were also no stones identified within the bladder. I noted no seeds or spacers on the floor of the bladder and retroflexion of the scope revealed no seeds protruding from the base of the prostate.  The cystoscope was then removed and the patient was awakened and taken to recovery room in stable and satisfactory condition. He tolerated procedure well and there were no intraoperative complications.

## 2018-04-14 NOTE — Transfer of Care (Signed)
  Last Vitals:  Vitals Value Taken Time  BP 141/93 04/14/2018  3:00 PM  Temp 36.7 C 04/14/2018  2:53 PM  Pulse 88 04/14/2018  3:03 PM  Resp 14 04/14/2018  3:03 PM  SpO2 95 % 04/14/2018  3:03 PM  Vitals shown include unvalidated device data.  Last Pain:  Vitals:   04/14/18 1453  TempSrc:   PainSc: 0-No pain      Patients Stated Pain Goal: 6 (04/14/18 1116)  Immediate Anesthesia Transfer of Care Note  Patient: Randy Alvarez  Procedure(s) Performed: Procedure(s) (LRB): RADIOACTIVE SEED IMPLANT/BRACHYTHERAPY IMPLANT (N/A) SPACE OAR INSTILLATION (N/A)  Patient Location: PACU  Anesthesia Type: General  Level of Consciousness: awake, alert  and oriented  Airway & Oxygen Therapy: Patient Spontanous Breathing and Patient connected to face mask oxygen  Post-op Assessment: Report given to PACU RN and Post -op Vital signs reviewed and stable  Post vital signs: Reviewed and stable  Complications: No apparent anesthesia complications

## 2018-04-14 NOTE — H&P (Signed)
Urology Preoperative H&P   Chief Complaint: Prostate cancer  History of Present Illness: Randy Alvarez is a 62 y.o. male with newly diagnosed Gleason 3+4=7 prostate cancer that was discovered on PNBx from 12/19/17.   Last PSA: 10.0, %free 8.6 (10/24/17). His PSA was 7.7 (03/2017). He states that his father and paternal uncle passed away from "cancer", but he is unsure which type.   From a voiding standpoint, he reports a good FOS and feels like he is emptying well. He has occasional urgency/frequency and nocturia x1. Denies interval UTIs, dysuria or hematuria.   SHIM- 24. He states that he has spontaneous erections, but takes sildenafil on occasion with a good response.   Randy Alvarez is here today for brachytherapy seed and SpaceOAR placement for the treatment of his prostate cancer.    Past Medical History:  Diagnosis Date  . Anxiety   . Depression   . GERD (gastroesophageal reflux disease)   . Hypercholesteremia   . Incomplete RBBB   . Pre-diabetes   . Prostate cancer [facility])     Past Surgical History:  Procedure Laterality Date  . APPENDECTOMY    . COLONOSCOPY    . descended testicle      age 64  . KNEE SURGERY Right   . nose operation    . nose operation    . PROSTATE BIOPSY  12/2017  . TONSILLECTOMY      Allergies:  Allergies  Allergen Reactions  . Other Other (See Comments)    Mood stabilizers ex. Wellbutrin had panic attacks  . Statins Other (See Comments)    "Goofy"  . Percocet [Oxycodone-Acetaminophen] Rash    Family History  Problem Relation Age of Onset  . Cancer Father   . Cancer Paternal Uncle     Social History:  reports that he has never smoked. He has never used smokeless tobacco. He reports that he drinks alcohol. He reports that he has current or past drug history. Drug: Marijuana.  ROS: A complete review of systems was performed.  All systems are negative except for pertinent findings as noted.  Physical Exam:  Vital signs in last 24 hours: Temp:   [97.6 F (36.4 C)] 97.6 F (36.4 C) (06/28 1100) Pulse Rate:  [64] 64 (06/28 1100) Resp:  [16] 16 (06/28 1100) BP: (137)/(98) 137/98 (06/28 1100) SpO2:  [98 %] 98 % (06/28 1100) Weight:  [114.6 kg (252 lb 9.6 oz)] 114.6 kg (252 lb 9.6 oz) (06/28 1100) Constitutional:  Alert and oriented, No acute distress Cardiovascular: Regular rate and rhythm, No JVD Respiratory: Normal respiratory effort, Lungs clear bilaterally GI: Abdomen is soft, nontender, nondistended, no abdominal masses GU: No CVA tenderness Lymphatic: No lymphadenopathy Neurologic: Grossly intact, no focal deficits Psychiatric: Normal mood and affect  Laboratory Data:  No results for input(s): WBC, HGB, HCT, PLT in the last 72 hours.  No results for input(s): NA, K, CL, GLUCOSE, BUN, CALCIUM, CREATININE in the last 72 hours.  Invalid input(s): CO3   No results found for this or any previous visit (from the past 24 hour(s)). No results found for this or any previous visit (from the past 240 hour(s)).  Renal Function: Recent Labs    04/07/18 1359  CREATININE 1.05   Estimated Creatinine Clearance: 96.3 mL/min (by C-G formula based on SCr of 1.05 mg/dL).  Radiologic Imaging: No results found.  I independently reviewed the above imaging studies.  Assessment and Plan Randy Alvarez is a 62 y.o. male with Gleason 3+4 prostate adenocarcinoma  -  The risks, benefits and alternatives of brachytherapy seed placement,  Space OAR placement and cystoscopy was discussed with the patient.  Risks include, but are not limited to, bleeding, urinary tract infection, prostate cancer recurrence, rectal injury, hematuria, blood per rectum, blood in the ejaculate, colovesical fistula, MI, CVA, PE, DVT and the inherent risks of general anesthesia.  He voices understanding and wishes to proceed.   Ellison Hughs, MD 04/14/2018, 11:51 AM  [facility] Pager: [phone]

## 2018-04-14 NOTE — Anesthesia Preprocedure Evaluation (Signed)
Anesthesia Evaluation  Patient identified by MRN, date of birth, ID band Patient awake    Reviewed: Allergy & Precautions, NPO status , Patient's Chart, lab work & pertinent test results  Airway Mallampati: II  TM Distance: >3 FB Neck ROM: Full    Dental no notable dental hx.    Pulmonary neg pulmonary ROS,    Pulmonary exam normal breath sounds clear to auscultation       Cardiovascular negative cardio ROS Normal cardiovascular exam Rhythm:Regular Rate:Normal     Neuro/Psych Anxiety Depression negative neurological ROS  negative psych ROS   GI/Hepatic negative GI ROS, Neg liver ROS, GERD  ,  Endo/Other  negative endocrine ROS  Renal/GU negative Renal ROS  negative genitourinary   Musculoskeletal negative musculoskeletal ROS (+)   Abdominal   Peds negative pediatric ROS (+)  Hematology negative hematology ROS (+)   Anesthesia Other Findings Prostate Cancer  Reproductive/Obstetrics negative OB ROS                             Anesthesia Physical Anesthesia Plan  ASA: II  Anesthesia Plan: General   Post-op Pain Management:    Induction: Intravenous  PONV Risk Score and Plan: 2 and Ondansetron and Midazolam  Airway Management Planned: LMA  Additional Equipment:   Intra-op Plan:   Post-operative Plan: Extubation in OR  Informed Consent: I have reviewed the patients History and Physical, chart, labs and discussed the procedure including the risks, benefits and alternatives for the proposed anesthesia with the patient or authorized representative who has indicated his/her understanding and acceptance.   Dental advisory given  Plan Discussed with: CRNA  Anesthesia Plan Comments:         Anesthesia Quick Evaluation

## 2018-04-16 NOTE — Progress Notes (Signed)
  Radiation Oncology         [phone] ________________________________  Name: Randy Alvarez MRN: [id number]  Date: 04/16/2018  DOB: [birth year]       Prostate Seed Implant  WF:UXNATFT, Courtney, PA-C  No ref. provider found  DIAGNOSIS: 62 y.o. gentleman with Stage T1c adenocarcinoma of the prostate with Gleason Score of 3+4, and PSA of 10    ICD-10-CM   1. Prostate cancer ([place]) C61 CANCELED: DG Chest 2 View    CANCELED: DG Chest 2 View    PROCEDURE: Insertion of radioactive I-125 seeds into the prostate gland.  RADIATION DOSE: 145 Gy, definitive therapy.  TECHNIQUE: [name] was brought to the operating room with the urologist. He was placed in the dorsolithotomy position. He was catheterized and a rectal tube was inserted. The perineum was shaved, prepped and draped. The ultrasound probe was then introduced into the rectum to see the prostate gland.  TREATMENT DEVICE: A needle grid was attached to the ultrasound probe stand and anchor needles were placed.  3D PLANNING: The prostate was imaged in 3D using a sagittal sweep of the prostate probe. These images were transferred to the planning computer. There, the prostate, urethra and rectum were defined on each axial reconstructed image. Then, the software created an optimized 3D plan and a few seed positions were adjusted. The quality of the plan was reviewed using [facility] information for the target and the following two organs at risk:  Urethra and Rectum.  Then the accepted plan was printed and handed off to the radiation therapist.  Under my supervision, the custom loading of the seeds and spacers was carried out and loaded into sealed vicryl sleeves.  These pre-loaded needles were then placed into the needle holder.Marland Kitchen  PROSTATE VOLUME STUDY:  Using transrectal ultrasound the volume of the prostate was verified to be 37.2 cc.  SPECIAL TREATMENT PROCEDURE/SUPERVISION AND HANDLING: The pre-loaded needles were then delivered under  sagittal guidance. A total of 22 needles were used to deposit 68 seeds in the prostate gland. The individual seed activity was 0.445 mCi.  SpaceOAR:  Yes  COMPLEX SIMULATION: At the end of the procedure, an anterior radiograph of the pelvis was obtained to document seed positioning and count. Cystoscopy was performed to check the urethra and bladder.  MICRODOSIMETRY: At the end of the procedure, the patient was emitting 0.137 mR/hr at 1 meter. Accordingly, he was considered safe for hospital discharge.  PLAN: The patient will return to the radiation oncology clinic for post implant CT dosimetry in three weeks.   ________________________________  Sheral Apley Tammi Klippel, M.D.

## 2018-04-17 ENCOUNTER — Encounter (HOSPITAL_BASED_OUTPATIENT_CLINIC_OR_DEPARTMENT_OTHER): Payer: Self-pay | Admitting: Urology

## 2018-05-02 ENCOUNTER — Telehealth: Payer: Self-pay | Admitting: Radiation Oncology

## 2018-05-02 NOTE — Telephone Encounter (Signed)
-----   Message from Freeman Caldron, [place] sent at 05/02/2018 11:48 AM EDT ----- Regarding: FW: phone call Sam, Will you please call this patient to explain that the prostate MRI is very important and necessary to accurately assess the quality of his seed implant.  We cannot see the SpaceOAR gel on CT.  This MRI is an abbreviated exam and less expensive than traditional MRI but he can always call patient accounting at [phone] to request an estimated cost for this exam and/or discuss financial concerns/assistance.  I would also offer a social work consult to discuss financial strain and any available assistance.  If he still refuses to have the prostate MRI, we just need to document this and let Abigail Butts and Dr. Tammi Klippel know since this will affect his post-seed imaging evaluation. Let me know if I need to put in a social work referral. Thank you! -Ashlyn ----- Message ----- From: Kerri Perches Sent: 05/02/2018  11:03 AM To: Freeman Caldron, PA-C Subject: phone call                                     Norton Pastel,   This patient just called and is not wanting to do his MRI on Friday, he says that he is having too many bills to come in and that he is almost homeless.  Mr. Jobe phone number is [phone].   Thanks,  [employer]

## 2018-05-02 NOTE — Telephone Encounter (Signed)
Phoned patient as requested by Freeman Caldron, PA-C. I explained that the prostate MRI is very important and necessary to accurately assess the quality of his seed implant. In addition I explained the MRI is also used to confirm placement of the SpaceOar. Verbalized that the MRI is an abbreviated exam and less expensive than the tradition MRI. Offered patient the number to patient accounting to obtain an estimate cost. Patient refused the number. Patient states, "I am not trying to be difficult." He went onto say, "if in good faith you tell me I must have this scan then I will do it but if it isn't necessary please don't make me." Patient explains his wife has medical bills as well. He states, "I went to being scare to death about the cancer to being scared to death of the bills." Patient praised the staff for the care he has received. Patient denies any urinary symptoms other than nocturia x 4. Patient accepted a social work referral. Patient understands this RN will place that referral then decisions can be made about moving forward with an MRI scan.

## 2018-05-04 ENCOUNTER — Telehealth: Payer: Self-pay | Admitting: *Deleted

## 2018-05-04 NOTE — Telephone Encounter (Signed)
CALLED PATIENT TO REMIND OF POST SEED APPTS. FOR 05-05-18, SPOKE WITH PATIENT AND HE IS AWARE OF THESE APPTS.

## 2018-05-05 ENCOUNTER — Encounter: Payer: Self-pay | Admitting: Radiation Oncology

## 2018-05-05 ENCOUNTER — Ambulatory Visit (HOSPITAL_COMMUNITY)
Admission: RE | Admit: 2018-05-05 | Discharge: 2018-05-05 | Disposition: A | Discharge status: Home / Self Care | Payer: BLUE CROSS/BLUE SHIELD | Source: Ambulatory Visit | Attending: Urology | Admitting: Urology

## 2018-05-05 ENCOUNTER — Ambulatory Visit
Admission: RE | Admit: 2018-05-05 | Discharge: 2018-05-05 | Disposition: A | Discharge status: Home / Self Care | Payer: BLUE CROSS/BLUE SHIELD | Source: Ambulatory Visit | Attending: Radiation Oncology | Admitting: Radiation Oncology

## 2018-05-05 ENCOUNTER — Other Ambulatory Visit: Payer: Self-pay

## 2018-05-05 ENCOUNTER — Encounter: Payer: Self-pay | Admitting: Medical Oncology

## 2018-05-05 VITALS — BP 134/88 | HR 83 | Temp 97.6°F | Resp 18 | Wt 258.8 lb

## 2018-05-05 DIAGNOSIS — Z79899 Other long term (current) drug therapy: Secondary | ICD-10-CM | POA: Diagnosis not present

## 2018-05-05 DIAGNOSIS — C61 Malignant neoplasm of prostate: Secondary | ICD-10-CM | POA: Insufficient documentation

## 2018-05-05 DIAGNOSIS — Z888 Allergy status to other drugs, medicaments and biological substances status: Secondary | ICD-10-CM | POA: Diagnosis not present

## 2018-05-05 DIAGNOSIS — Y842 Radiological procedure and radiotherapy as the cause of abnormal reaction of the patient, or of later complication, without mention of misadventure at the time of the procedure: Secondary | ICD-10-CM | POA: Diagnosis not present

## 2018-05-05 DIAGNOSIS — Z7982 Long term (current) use of aspirin: Secondary | ICD-10-CM | POA: Diagnosis not present

## 2018-05-05 DIAGNOSIS — Z885 Allergy status to narcotic agent status: Secondary | ICD-10-CM | POA: Diagnosis not present

## 2018-05-05 NOTE — Progress Notes (Signed)
Mr. Randy Alvarez states he is doing well post brachytherapy. He states he has nocturia x 3 and a constant sense of urgency but other wise doing well.  He had called with concerns about his medical bills and the anxiety it has caused earlier in the week. He states he was probably over acting and states he  has a Alvarez handle on things. He was able to set up a payment plan with [facility] which has helped reduce his anxiety. I encouraged him to call if he would like to talk with our support team. He voiced understanding.

## 2018-05-05 NOTE — Progress Notes (Signed)
Weight and vitals stable. Denies pain. Reports dysuria that is uncomfortable and "almost erotic." Denies hematuria. Denies urinary leakage or incontinence. Denies diarrhea. Scheduled to follow up with urologist 8/19. Scheduled for MRI today at 1530 to confirm SpaceOar placement. Reports fatigue. Pre seed IPSS 11. Post seed IPSS 10.  BP 134/88   Pulse 83   Temp 97.6 F (36.4 C) (Oral)   Resp 18   Wt 258 lb 12.8 oz (117.4 kg)   SpO2 97%   BMI 33.68 kg/m

## 2018-05-05 NOTE — Progress Notes (Signed)
  Radiation Oncology         [phone] ________________________________  Name: Randy Alvarez MRN: [id number]  Date: 05/05/2018  DOB: 06/18/1956  COMPLEX SIMULATION NOTE  NARRATIVE:  The patient was brought to the [facility] today following prostate seed implantation approximately one month ago.  Identity was confirmed.  All relevant records and images related to the planned course of therapy were reviewed.  Then, the patient was set-up supine.  CT images were obtained.  The CT images were loaded into the planning software.  Then the prostate and rectum were contoured.  Treatment planning then occurred.  The implanted iodine 125 seeds were identified by the physics staff for projection of radiation distribution  I have requested : 3D Simulation  I have requested a DVH of the following structures: Prostate and rectum.    ________________________________  Sheral Apley Tammi Klippel, M.D.

## 2018-05-05 NOTE — Progress Notes (Signed)
Radiation Oncology         [phone]) [phone] ________________________________  Name: Randy Alvarez MRN: [id number]  Date: 05/05/2018  DOB: 03-06-1956  Post-Seed Follow-Up Visit Note  CC: Marda Stalker, PA-C  Winter, Christopher Aar*  Diagnosis:   62 y.o. gentleman with Stage T1c adenocarcinoma of the prostate with Gleason Score of 3+4, and PSA of 10    ICD-10-CM   1. Malignant neoplasm of prostate ([place]) C61     Interval Since Last Radiation:  3 weeks  04/14/18:  Insertion of radioactive I-125 seeds into the prostate gland; 145 Gy, definitive therapy with placement of SpaceOAR gel.  Narrative:  The patient returns today for routine follow-up.  He is complaining of increased urinary frequency and urinary hesitation symptoms. He filled out a questionnaire regarding urinary function today providing and overall IPSS score of 10 characterizing his symptoms as moderate.  His pre-implant score was 11.  He has mild persistent urgency and frequency as well as nocturia x3 per night.  He reports a tingling sensation throughout his urethra which is present constantly and does exacerbate with urination and ejaculation but denies pain.  He denies gross hematuria, weak stream, incomplete bladder emptying or incontinence.  He continues with mild to moderate fatigue but reports staying active.  He reports a healthy appetite and denies abdominal pain, nausea, vomiting or diarrhea.    ALLERGIES:  is allergic to other; statins; and percocet [oxycodone-acetaminophen].  Meds: Current Outpatient Medications  Medication Sig Dispense Refill  . aspirin EC 81 MG tablet Take 81 mg by mouth daily.    . phenazopyridine (PYRIDIUM) 200 MG tablet Take 1 tablet (200 mg total) by mouth 3 (three) times daily as needed (for pain with urination). 30 tablet 0  . sildenafil (REVATIO) 20 MG tablet Take 20 mg by mouth as needed.      No current facility-administered medications for this encounter.     Physical Findings: In  general this is a well appearing Caucasian male in no acute distress.  He's alert and oriented x4 and appropriate throughout the examination. Cardiopulmonary assessment is negative for acute distress and he exhibits normal effort.   Lab Findings: Lab Results  Component Value Date   WBC 7.2 04/07/2018   HGB 15.6 04/14/2018   HCT 46.0 04/14/2018   MCV 91.9 04/07/2018   PLT 204 04/07/2018    Radiographic Findings:  Patient underwent CT imaging in our clinic for post implant dosimetry. The CT was reviewed by Dr. Tammi Klippel and appears to demonstrate an adequate distribution of radioactive seeds throughout the prostate gland. There are no seeds in or near the rectum.  He is scheduled for an MRI prostate at 5 PM today.  The MRI images will be fused with the CT images for further evaluation.  We suspect the final radiation plan and dosimetry will show appropriate coverage of the prostate gland.   Impression/Plan: The patient is recovering from the effects of radiation. His urinary symptoms should gradually improve over the next 4-6 months. We talked about this today. He is encouraged by his improvement already and is otherwise pleased with his outcome. We also talked about long-term follow-up for prostate cancer following seed implant. He understands that ongoing PSA determinations and digital rectal exams will help perform surveillance to rule out disease recurrence. He has a follow up appointment scheduled with Dr. Lovena Neighbours on 06/05/18. He understands what to expect with his PSA measures. Patient was also educated today about some of the long-term effects from radiation  including a small risk for rectal bleeding and possibly erectile dysfunction. We talked about some of the general management approaches to these potential complications. However, I did encourage the patient to contact our office or return at any point if he has questions or concerns related to his previous radiation and prostate  cancer.    Nicholos Johns, PA-C

## 2018-05-05 NOTE — Addendum Note (Signed)
Encounter addended by: Heywood Footman, RN on: 05/05/2018 2:18 PM  Actions taken: Charge Capture section accepted

## 2018-05-16 DIAGNOSIS — R300 Dysuria: Secondary | ICD-10-CM | POA: Diagnosis not present

## 2018-05-23 DIAGNOSIS — M2041 Other hammer toe(s) (acquired), right foot: Secondary | ICD-10-CM | POA: Diagnosis not present

## 2018-05-23 DIAGNOSIS — M19079 Primary osteoarthritis, unspecified ankle and foot: Secondary | ICD-10-CM | POA: Diagnosis not present

## 2018-05-23 DIAGNOSIS — M2042 Other hammer toe(s) (acquired), left foot: Secondary | ICD-10-CM | POA: Diagnosis not present

## 2018-05-23 DIAGNOSIS — M7741 Metatarsalgia, right foot: Secondary | ICD-10-CM | POA: Diagnosis not present

## 2018-05-23 DIAGNOSIS — M7742 Metatarsalgia, left foot: Secondary | ICD-10-CM | POA: Diagnosis not present

## 2018-06-02 ENCOUNTER — Encounter: Payer: Self-pay | Admitting: Radiation Oncology

## 2018-06-02 DIAGNOSIS — C61 Malignant neoplasm of prostate: Secondary | ICD-10-CM | POA: Diagnosis not present

## 2018-06-03 NOTE — Progress Notes (Signed)
  Radiation Oncology         [phone] ________________________________  Name: Randy Alvarez MRN: [id number]  Date: 06/02/2018  DOB: 02/23/1956  3D Planning Note   Prostate Brachytherapy Post-Implant Dosimetry  Diagnosis: 62 y.o. male with Stage T1c adenocarcinoma of the prostate with Gleason Score of 3+4, and PSA of 10  Narrative: On a previous date, Randy Alvarez returned following prostate seed implantation for post implant planning. He underwent CT scan complex simulation to delineate the three-dimensional structures of the pelvis and demonstrate the radiation distribution.  Since that time, the seed localization, and complex isodose planning with dose volume histograms have now been completed.  Results:   Prostate Coverage - The dose of radiation delivered to the 90% or more of the prostate gland (D90) was 106.42% of the prescription dose. This exceeds our goal of greater than 90%. Rectal Sparing - The volume of rectal tissue receiving the prescription dose or higher was 0.0 cc. This falls under our thresholds tolerance of 1.0 cc.  Impression: The prostate seed implant appears to show adequate target coverage and appropriate rectal sparing.  Plan:  The patient will continue to follow with urology for ongoing PSA determinations. I would anticipate a high likelihood for local tumor control with minimal risk for rectal morbidity.  ________________________________  Sheral Apley Tammi Klippel, M.D.

## 2018-06-05 DIAGNOSIS — C61 Malignant neoplasm of prostate: Secondary | ICD-10-CM | POA: Diagnosis not present

## 2018-06-22 DIAGNOSIS — S30861A Insect bite (nonvenomous) of abdominal wall, initial encounter: Secondary | ICD-10-CM | POA: Diagnosis not present

## 2018-06-22 DIAGNOSIS — Z23 Encounter for immunization: Secondary | ICD-10-CM | POA: Diagnosis not present

## 2018-06-22 DIAGNOSIS — N400 Enlarged prostate without lower urinary tract symptoms: Secondary | ICD-10-CM | POA: Diagnosis not present

## 2018-06-22 DIAGNOSIS — F322 Major depressive disorder, single episode, severe without psychotic features: Secondary | ICD-10-CM | POA: Diagnosis not present

## 2018-06-22 DIAGNOSIS — Z0000 Encounter for general adult medical examination without abnormal findings: Secondary | ICD-10-CM | POA: Diagnosis not present

## 2018-06-22 DIAGNOSIS — F419 Anxiety disorder, unspecified: Secondary | ICD-10-CM | POA: Diagnosis not present

## 2018-06-22 DIAGNOSIS — C61 Malignant neoplasm of prostate: Secondary | ICD-10-CM | POA: Diagnosis not present

## 2018-06-30 DIAGNOSIS — R7309 Other abnormal glucose: Secondary | ICD-10-CM | POA: Diagnosis not present

## 2018-06-30 DIAGNOSIS — E7800 Pure hypercholesterolemia, unspecified: Secondary | ICD-10-CM | POA: Diagnosis not present

## 2018-07-14 DIAGNOSIS — C61 Malignant neoplasm of prostate: Secondary | ICD-10-CM | POA: Diagnosis not present

## 2018-08-17 DIAGNOSIS — R0602 Shortness of breath: Secondary | ICD-10-CM | POA: Diagnosis not present

## 2018-08-17 DIAGNOSIS — R0789 Other chest pain: Secondary | ICD-10-CM | POA: Diagnosis not present

## 2018-08-25 DIAGNOSIS — C61 Malignant neoplasm of prostate: Secondary | ICD-10-CM | POA: Diagnosis not present

## 2018-09-01 DIAGNOSIS — R300 Dysuria: Secondary | ICD-10-CM | POA: Diagnosis not present

## 2018-09-01 DIAGNOSIS — C61 Malignant neoplasm of prostate: Secondary | ICD-10-CM | POA: Diagnosis not present

## 2018-11-21 DIAGNOSIS — G479 Sleep disorder, unspecified: Secondary | ICD-10-CM | POA: Diagnosis not present

## 2018-12-21 DIAGNOSIS — N400 Enlarged prostate without lower urinary tract symptoms: Secondary | ICD-10-CM | POA: Diagnosis not present

## 2018-12-21 DIAGNOSIS — E7800 Pure hypercholesterolemia, unspecified: Secondary | ICD-10-CM | POA: Diagnosis not present

## 2018-12-21 DIAGNOSIS — G479 Sleep disorder, unspecified: Secondary | ICD-10-CM | POA: Diagnosis not present

## 2018-12-21 DIAGNOSIS — F419 Anxiety disorder, unspecified: Secondary | ICD-10-CM | POA: Diagnosis not present

## 2018-12-21 DIAGNOSIS — R7309 Other abnormal glucose: Secondary | ICD-10-CM | POA: Diagnosis not present

## 2019-02-16 DIAGNOSIS — R062 Wheezing: Secondary | ICD-10-CM | POA: Diagnosis not present

## 2019-02-16 DIAGNOSIS — R05 Cough: Secondary | ICD-10-CM | POA: Diagnosis not present

## 2019-02-16 DIAGNOSIS — J309 Allergic rhinitis, unspecified: Secondary | ICD-10-CM | POA: Diagnosis not present

## 2019-06-01 ENCOUNTER — Encounter: Payer: Self-pay | Admitting: *Deleted

## 2019-12-14 ENCOUNTER — Ambulatory Visit: Payer: Medicare Other | Attending: Internal Medicine

## 2019-12-14 DIAGNOSIS — Z23 Encounter for immunization: Secondary | ICD-10-CM | POA: Insufficient documentation

## 2019-12-14 NOTE — Progress Notes (Signed)
   Covid-19 Vaccination Clinic  Name:  Randy Alvarez    MRN: [id number] DOB: 1955/11/13  12/14/2019  [name] was observed post Covid-19 immunization for 15 minutes without incidence. He was provided with Vaccine Information Sheet and instruction to access the V-Safe system.   [name] was instructed to call 911 with any severe reactions post vaccine: Marland Kitchen Difficulty breathing  . Swelling of your face and throat  . A fast heartbeat  . A bad rash all over your body  . Dizziness and weakness    Immunizations Administered    Name Date Dose VIS Date Route   Pfizer COVID-19 Vaccine 12/14/2019  9:07 AM 0.3 mL 09/28/2019 Intramuscular   Manufacturer: [place]   Lot: [id number]   [place]: [id number]

## 2019-12-21 ENCOUNTER — Encounter: Payer: Self-pay | Admitting: *Deleted

## 2020-01-08 ENCOUNTER — Ambulatory Visit: Payer: Medicare Other | Attending: Internal Medicine

## 2020-01-08 DIAGNOSIS — Z23 Encounter for immunization: Secondary | ICD-10-CM

## 2020-01-08 NOTE — Progress Notes (Signed)
   Covid-19 Vaccination Clinic  Name:  ATIKSH GANDEE    MRN: [id number] DOB: 1956/01/04  01/08/2020  Mr. Aragon was observed post Covid-19 immunization for 15 minutes without incident. He was provided with Vaccine Information Sheet and instruction to access the V-Safe system.   Mr. Baka was instructed to call 911 with any severe reactions post vaccine: Marland Kitchen Difficulty breathing  . Swelling of face and throat  . A fast heartbeat  . A bad rash all over body  . Dizziness and weakness   Immunizations Administered    Name Date Dose VIS Date Route   Pfizer COVID-19 Vaccine 01/08/2020 10:10 AM 0.3 mL 09/28/2019 Intramuscular   Manufacturer: [place]   Lot: [id number]   [place]: [id number]

## 2020-05-12 ENCOUNTER — Ambulatory Visit (INDEPENDENT_AMBULATORY_CARE_PROVIDER_SITE_OTHER): Payer: Medicare Other

## 2020-05-12 ENCOUNTER — Other Ambulatory Visit: Payer: Self-pay

## 2020-05-12 ENCOUNTER — Ambulatory Visit: Payer: Medicare Other | Admitting: Podiatry

## 2020-05-12 DIAGNOSIS — G8929 Other chronic pain: Secondary | ICD-10-CM | POA: Diagnosis not present

## 2020-05-12 DIAGNOSIS — M79671 Pain in right foot: Secondary | ICD-10-CM | POA: Diagnosis not present

## 2020-05-12 DIAGNOSIS — R609 Edema, unspecified: Secondary | ICD-10-CM

## 2020-05-12 DIAGNOSIS — M722 Plantar fascial fibromatosis: Secondary | ICD-10-CM | POA: Diagnosis not present

## 2020-05-12 NOTE — Patient Instructions (Signed)
For instructions on how to put on your Plantar Fascial Brace, please visit [email or web address]   Plantar Fasciitis (Heel Spur Syndrome) with Rehab The plantar fascia is a fibrous, ligament-like, soft-tissue structure that spans the bottom of the foot. Plantar fasciitis is a condition that causes pain in the foot due to inflammation of the tissue. SYMPTOMS   Pain and tenderness on the underneath side of the foot.  Pain that worsens with standing or walking. CAUSES  Plantar fasciitis is caused by irritation and injury to the plantar fascia on the underneath side of the foot. Common mechanisms of injury include:  Direct trauma to bottom of the foot.  Damage to a small nerve that runs under the foot where the main fascia attaches to the heel bone.  Stress placed on the plantar fascia due to bone spurs. RISK INCREASES WITH:   Activities that place stress on the plantar fascia (running, jumping, pivoting, or cutting).  Poor strength and flexibility.  Improperly fitted shoes.  Tight calf muscles.  Flat feet.  Failure to warm-up properly before activity.  Obesity. PREVENTION  Warm up and stretch properly before activity.  Allow for adequate recovery between workouts.  Maintain physical fitness:  Strength, flexibility, and endurance.  Cardiovascular fitness.  Maintain a health body weight.  Avoid stress on the plantar fascia.  Wear properly fitted shoes, including arch supports for individuals who have flat feet.  PROGNOSIS  If treated properly, then the symptoms of plantar fasciitis usually resolve without surgery. However, occasionally surgery is necessary.  RELATED COMPLICATIONS   Recurrent symptoms that may result in a chronic condition.  Problems of the lower back that are caused by compensating for the injury, such as limping.  Pain or weakness of the foot during push-off following surgery.  Chronic inflammation, scarring, and partial or complete  fascia tear, occurring more often from repeated injections.  TREATMENT  Treatment initially involves the use of ice and medication to help reduce pain and inflammation. The use of strengthening and stretching exercises may help reduce pain with activity, especially stretches of the Achilles tendon. These exercises may be performed at home or with a therapist. Your caregiver may recommend that you use heel cups of arch supports to help reduce stress on the plantar fascia. Occasionally, corticosteroid injections are given to reduce inflammation. If symptoms persist for greater than 6 months despite non-surgical (conservative), then surgery may be recommended.   MEDICATION   If pain medication is necessary, then nonsteroidal anti-inflammatory medications, such as aspirin and ibuprofen, or other minor pain relievers, such as acetaminophen, are often recommended.  Do not take pain medication within 7 days before surgery.  Prescription pain relievers may be given if deemed necessary by your caregiver. Use only as directed and only as much as you need.  Corticosteroid injections may be given by your caregiver. These injections should be reserved for the most serious cases, because they may only be given a certain number of times.  HEAT AND COLD  Cold treatment (icing) relieves pain and reduces inflammation. Cold treatment should be applied for 10 to 15 minutes every 2 to 3 hours for inflammation and pain and immediately after any activity that aggravates your symptoms. Use ice packs or massage the area with a piece of ice (ice massage).  Heat treatment may be used prior to performing the stretching and strengthening activities prescribed by your caregiver, physical therapist, or athletic trainer. Use a heat pack or soak the injury in warm water.  SEEK IMMEDIATE MEDICAL   CARE IF:  Treatment seems to offer no benefit, or the condition worsens.  Any medications produce adverse side effects.   EXERCISES- RANGE OF MOTION (ROM) AND STRETCHING EXERCISES - Plantar Fasciitis (Heel Spur Syndrome) These exercises may help you when beginning to rehabilitate your injury. Your symptoms may resolve with or without further involvement from your physician, physical therapist or athletic trainer. While completing these exercises, remember:   Restoring tissue flexibility helps normal motion to return to the joints. This allows healthier, less painful movement and activity.  An effective stretch should be held for at least 30 seconds.  A stretch should never be painful. You should only feel a gentle lengthening or release in the stretched tissue.  RANGE OF MOTION - Toe Extension, Flexion  Sit with your right / left leg crossed over your opposite knee.  Grasp your toes and gently pull them back toward the top of your foot. You should feel a stretch on the bottom of your toes and/or foot.  Hold this stretch for 10 seconds.  Now, gently pull your toes toward the bottom of your foot. You should feel a stretch on the top of your toes and or foot.  Hold this stretch for 10 seconds. Repeat  times. Complete this stretch 3 times per day.   RANGE OF MOTION - Ankle Dorsiflexion, Active Assisted  Remove shoes and sit on a chair that is preferably not on a carpeted surface.  Place right / left foot under knee. Extend your opposite leg for support.  Keeping your heel down, slide your right / left foot back toward the chair until you feel a stretch at your ankle or calf. If you do not feel a stretch, slide your bottom forward to the edge of the chair, while still keeping your heel down.  Hold this stretch for 10 seconds. Repeat 3 times. Complete this stretch 2 times per day.   STRETCH  Gastroc, Standing  Place hands on wall.  Extend right / left leg, keeping the front knee somewhat bent.  Slightly point your toes inward on your back foot.  Keeping your right / left heel on the floor and your  knee straight, shift your weight toward the wall, not allowing your back to arch.  You should feel a gentle stretch in the right / left calf. Hold this position for 10 seconds. Repeat 3 times. Complete this stretch 2 times per day.  STRETCH  Soleus, Standing  Place hands on wall.  Extend right / left leg, keeping the other knee somewhat bent.  Slightly point your toes inward on your back foot.  Keep your right / left heel on the floor, bend your back knee, and slightly shift your weight over the back leg so that you feel a gentle stretch deep in your back calf.  Hold this position for 10 seconds. Repeat 3 times. Complete this stretch 2 times per day.  STRETCH  Gastrocsoleus, Standing  Note: This exercise can place a lot of stress on your foot and ankle. Please complete this exercise only if specifically instructed by your caregiver.   Place the ball of your right / left foot on a step, keeping your other foot firmly on the same step.  Hold on to the wall or a rail for balance.  Slowly lift your other foot, allowing your body weight to press your heel down over the edge of the step.  You should feel a stretch in your right / left calf.  Hold this   position for 10 seconds.  Repeat this exercise with a slight bend in your right / left knee. Repeat 3 times. Complete this stretch 2 times per day.   STRENGTHENING EXERCISES - Plantar Fasciitis (Heel Spur Syndrome)  These exercises may help you when beginning to rehabilitate your injury. They may resolve your symptoms with or without further involvement from your physician, physical therapist or athletic trainer. While completing these exercises, remember:   Muscles can gain both the endurance and the strength needed for everyday activities through controlled exercises.  Complete these exercises as instructed by your physician, physical therapist or athletic trainer. Progress the resistance and repetitions only as guided.  STRENGTH -  Towel Curls  Sit in a chair positioned on a non-carpeted surface.  Place your foot on a towel, keeping your heel on the floor.  Pull the towel toward your heel by only curling your toes. Keep your heel on the floor. Repeat 3 times. Complete this exercise 2 times per day.  STRENGTH - Ankle Inversion  Secure one end of a rubber exercise band/tubing to a fixed object (table, pole). Loop the other end around your foot just before your toes.  Place your fists between your knees. This will focus your strengthening at your ankle.  Slowly, pull your big toe up and in, making sure the band/tubing is positioned to resist the entire motion.  Hold this position for 10 seconds.  Have your muscles resist the band/tubing as it slowly pulls your foot back to the starting position. Repeat 3 times. Complete this exercises 2 times per day.  Document Released: 10/04/2005 Document Revised: 12/27/2011 Document Reviewed: 01/16/2009 [employer] Patient Information 2014 [employer]. 

## 2020-05-13 NOTE — Progress Notes (Signed)
Subjective:   Patient ID: Randy Alvarez, male   DOB: 64 y.o.   MRN: [id number]   HPI 64 year old male presents the office today for concerns of pain to the bottom of his right heel as well as the arch of the foot.  He states that he was having some achiness to this area however on Friday, July 23 he was playing pickle ball and is noticed increased pain to this area.  Denies any falls but did start after he twisted his foot.  Denies any popping sensation.  No bruising.  Minimal swelling.  He has no other concerns today.  No recent treatment.   Review of Systems  All other systems reviewed and are negative.  Past Medical History:  Diagnosis Date  . Anxiety   . Depression   . GERD (gastroesophageal reflux disease)   . Hypercholesteremia   . Incomplete RBBB   . Pre-diabetes   . Prostate cancer [facility])     Past Surgical History:  Procedure Laterality Date  . APPENDECTOMY    . COLONOSCOPY    . descended testicle      age 27  . KNEE SURGERY Right   . nose operation    . nose operation    . PROSTATE BIOPSY  12/2017  . RADIOACTIVE SEED IMPLANT N/A 04/14/2018   Procedure: RADIOACTIVE SEED IMPLANT/BRACHYTHERAPY IMPLANT;  Surgeon: Ceasar Mons, MD;  Location: [facility];  Service: Urology;  Laterality: N/A;  . SPACE OAR INSTILLATION N/A 04/14/2018   Procedure: SPACE OAR INSTILLATION;  Surgeon: Ceasar Mons, MD;  Location: [facility];  Service: Urology;  Laterality: N/A;  . TONSILLECTOMY       Current Outpatient Medications:  .  aspirin EC 81 MG tablet, Take 81 mg by mouth daily., Disp: , Rfl:  .  sildenafil (REVATIO) 20 MG tablet, Take 20 mg by mouth as needed. , Disp: , Rfl:   Allergies  Allergen Reactions  . Other Other (See Comments)    Mood stabilizers ex. Wellbutrin had panic attacks  . Statins Other (See Comments)    "Goofy"  . Percocet [Oxycodone-Acetaminophen] Rash         Objective:  Physical Exam  General:  AAO x3, NAD  Dermatological: Skin is warm, dry and supple bilateral. Nails x 10 are well manicured; remaining integument appears unremarkable at this time. There are no open sores, no preulcerative lesions, no rash or signs of infection present.  Vascular: Dorsalis Pedis artery and Posterior Tibial artery pedal pulses are 2/4 bilateral with immedate capillary fill time. There is no pain with calf compression, swelling, warmth, erythema.   Neruologic: Grossly intact via light touch bilateral.  Negative Tinel sign  Musculoskeletal: Tenderness to palpation along the plantar medial tubercle of the calcaneus at the insertion of plantar fascia on the right foot. There is mild pain along the course of the plantar fascia within the arch of the foot. Plantar fascia appears to be intact. There is no pain with lateral compression of the calcaneus or pain with vibratory sensation. There is no pain along the course or insertion of the achilles tendon. No other areas of tenderness to bilateral lower extremities. Muscular strength 5/5 in all groups tested bilateral.  No pain in the ankle.  Gait: Unassisted, Nonantalgic.       Assessment:   Right heel pain, plantar fasciitis     Plan:  -Treatment options discussed including all alternatives, risks, and complications -Etiology of symptoms were discussed -X-rays  were obtained and reviewed with the patient.  No evidence of acute fracture or stress fracture identified today. -Steroid injection performed.  See procedure note below. -Plantar fascial brace dispensed -Prescribed mobic. Discussed side effects of the medication and directed to stop if any are to occur and call the office.  -Discussed shoe modifications and orthotics -We will hold off on pickle ball for the week until starts to feel better than he can start to return but discussed stretching, icing daily.    Procedure: Injection Tendon/Ligament Discussed alternatives, risks, complications and  verbal consent was obtained.  Location: Right plantar fascia at the glabrous junction; medial approach. Skin Prep: Alcohol. Injectate: 0.5cc 0.5% marcaine plain, 0.5 cc 2% lidocaine plain and, 1 cc kenalog 10. Disposition: Patient tolerated procedure well. Injection site dressed with a band-aid.  Post-injection care was discussed and return precautions discussed.   Return in about 4 weeks (around 06/09/2020).  Trula Slade DPM

## 2020-05-14 ENCOUNTER — Other Ambulatory Visit: Payer: Self-pay | Admitting: Podiatry

## 2020-05-14 DIAGNOSIS — M79671 Pain in right foot: Secondary | ICD-10-CM

## 2020-11-05 DIAGNOSIS — E7800 Pure hypercholesterolemia, unspecified: Secondary | ICD-10-CM | POA: Diagnosis not present

## 2020-11-05 DIAGNOSIS — E1165 Type 2 diabetes mellitus with hyperglycemia: Secondary | ICD-10-CM | POA: Diagnosis not present

## 2020-11-05 DIAGNOSIS — E1169 Type 2 diabetes mellitus with other specified complication: Secondary | ICD-10-CM | POA: Diagnosis not present

## 2020-11-05 DIAGNOSIS — G4700 Insomnia, unspecified: Secondary | ICD-10-CM | POA: Diagnosis not present

## 2020-11-05 DIAGNOSIS — K219 Gastro-esophageal reflux disease without esophagitis: Secondary | ICD-10-CM | POA: Diagnosis not present

## 2020-12-02 DIAGNOSIS — K625 Hemorrhage of anus and rectum: Secondary | ICD-10-CM | POA: Diagnosis not present

## 2020-12-02 DIAGNOSIS — E1165 Type 2 diabetes mellitus with hyperglycemia: Secondary | ICD-10-CM | POA: Diagnosis not present

## 2020-12-02 DIAGNOSIS — K5900 Constipation, unspecified: Secondary | ICD-10-CM | POA: Diagnosis not present

## 2020-12-02 DIAGNOSIS — R195 Other fecal abnormalities: Secondary | ICD-10-CM | POA: Diagnosis not present

## 2020-12-03 DIAGNOSIS — K625 Hemorrhage of anus and rectum: Secondary | ICD-10-CM | POA: Diagnosis not present

## 2020-12-03 DIAGNOSIS — K5900 Constipation, unspecified: Secondary | ICD-10-CM | POA: Diagnosis not present

## 2020-12-05 DIAGNOSIS — E1169 Type 2 diabetes mellitus with other specified complication: Secondary | ICD-10-CM | POA: Diagnosis not present

## 2020-12-05 DIAGNOSIS — G4700 Insomnia, unspecified: Secondary | ICD-10-CM | POA: Diagnosis not present

## 2020-12-05 DIAGNOSIS — Z01812 Encounter for preprocedural laboratory examination: Secondary | ICD-10-CM | POA: Diagnosis not present

## 2020-12-05 DIAGNOSIS — E7800 Pure hypercholesterolemia, unspecified: Secondary | ICD-10-CM | POA: Diagnosis not present

## 2020-12-05 DIAGNOSIS — E1165 Type 2 diabetes mellitus with hyperglycemia: Secondary | ICD-10-CM | POA: Diagnosis not present

## 2020-12-05 DIAGNOSIS — K219 Gastro-esophageal reflux disease without esophagitis: Secondary | ICD-10-CM | POA: Diagnosis not present

## 2020-12-08 DIAGNOSIS — H903 Sensorineural hearing loss, bilateral: Secondary | ICD-10-CM | POA: Diagnosis not present

## 2020-12-08 DIAGNOSIS — Z9889 Other specified postprocedural states: Secondary | ICD-10-CM | POA: Diagnosis not present

## 2020-12-08 DIAGNOSIS — R0981 Nasal congestion: Secondary | ICD-10-CM | POA: Diagnosis not present

## 2020-12-08 DIAGNOSIS — J3489 Other specified disorders of nose and nasal sinuses: Secondary | ICD-10-CM | POA: Diagnosis not present

## 2020-12-08 DIAGNOSIS — J309 Allergic rhinitis, unspecified: Secondary | ICD-10-CM | POA: Diagnosis not present

## 2020-12-08 DIAGNOSIS — J321 Chronic frontal sinusitis: Secondary | ICD-10-CM | POA: Diagnosis not present

## 2020-12-08 DIAGNOSIS — R430 Anosmia: Secondary | ICD-10-CM | POA: Diagnosis not present

## 2020-12-10 DIAGNOSIS — R109 Unspecified abdominal pain: Secondary | ICD-10-CM | POA: Diagnosis not present

## 2020-12-10 DIAGNOSIS — K621 Rectal polyp: Secondary | ICD-10-CM | POA: Diagnosis not present

## 2020-12-10 DIAGNOSIS — D123 Benign neoplasm of transverse colon: Secondary | ICD-10-CM | POA: Diagnosis not present

## 2020-12-10 DIAGNOSIS — K6389 Other specified diseases of intestine: Secondary | ICD-10-CM | POA: Diagnosis not present

## 2020-12-10 DIAGNOSIS — K633 Ulcer of intestine: Secondary | ICD-10-CM | POA: Diagnosis not present

## 2020-12-10 DIAGNOSIS — D122 Benign neoplasm of ascending colon: Secondary | ICD-10-CM | POA: Diagnosis not present

## 2020-12-10 DIAGNOSIS — K921 Melena: Secondary | ICD-10-CM | POA: Diagnosis not present

## 2020-12-10 DIAGNOSIS — D120 Benign neoplasm of cecum: Secondary | ICD-10-CM | POA: Diagnosis not present

## 2020-12-15 DIAGNOSIS — D122 Benign neoplasm of ascending colon: Secondary | ICD-10-CM | POA: Diagnosis not present

## 2020-12-15 DIAGNOSIS — D120 Benign neoplasm of cecum: Secondary | ICD-10-CM | POA: Diagnosis not present

## 2020-12-15 DIAGNOSIS — D123 Benign neoplasm of transverse colon: Secondary | ICD-10-CM | POA: Diagnosis not present

## 2020-12-15 DIAGNOSIS — K621 Rectal polyp: Secondary | ICD-10-CM | POA: Diagnosis not present

## 2020-12-24 DIAGNOSIS — G4700 Insomnia, unspecified: Secondary | ICD-10-CM | POA: Diagnosis not present

## 2020-12-24 DIAGNOSIS — E1165 Type 2 diabetes mellitus with hyperglycemia: Secondary | ICD-10-CM | POA: Diagnosis not present

## 2020-12-24 DIAGNOSIS — E1169 Type 2 diabetes mellitus with other specified complication: Secondary | ICD-10-CM | POA: Diagnosis not present

## 2020-12-24 DIAGNOSIS — E7800 Pure hypercholesterolemia, unspecified: Secondary | ICD-10-CM | POA: Diagnosis not present

## 2020-12-24 DIAGNOSIS — K219 Gastro-esophageal reflux disease without esophagitis: Secondary | ICD-10-CM | POA: Diagnosis not present

## 2021-01-01 DIAGNOSIS — R3915 Urgency of urination: Secondary | ICD-10-CM | POA: Diagnosis not present

## 2021-01-08 ENCOUNTER — Other Ambulatory Visit (HOSPITAL_COMMUNITY): Payer: Self-pay | Admitting: Urology

## 2021-01-08 DIAGNOSIS — R9721 Rising PSA following treatment for malignant neoplasm of prostate: Secondary | ICD-10-CM

## 2021-01-08 DIAGNOSIS — C61 Malignant neoplasm of prostate: Secondary | ICD-10-CM

## 2021-01-21 DIAGNOSIS — G4700 Insomnia, unspecified: Secondary | ICD-10-CM | POA: Diagnosis not present

## 2021-01-21 DIAGNOSIS — E7800 Pure hypercholesterolemia, unspecified: Secondary | ICD-10-CM | POA: Diagnosis not present

## 2021-01-21 DIAGNOSIS — K219 Gastro-esophageal reflux disease without esophagitis: Secondary | ICD-10-CM | POA: Diagnosis not present

## 2021-01-21 DIAGNOSIS — E1169 Type 2 diabetes mellitus with other specified complication: Secondary | ICD-10-CM | POA: Diagnosis not present

## 2021-01-21 DIAGNOSIS — E1165 Type 2 diabetes mellitus with hyperglycemia: Secondary | ICD-10-CM | POA: Diagnosis not present

## 2021-01-22 ENCOUNTER — Ambulatory Visit (HOSPITAL_COMMUNITY)
Admission: RE | Admit: 2021-01-22 | Discharge: 2021-01-22 | Disposition: A | Discharge status: Home / Self Care | Payer: Medicare Other | Source: Ambulatory Visit | Attending: Urology | Admitting: Urology

## 2021-01-22 ENCOUNTER — Other Ambulatory Visit: Payer: Self-pay

## 2021-01-22 DIAGNOSIS — R9721 Rising PSA following treatment for malignant neoplasm of prostate: Secondary | ICD-10-CM | POA: Diagnosis present

## 2021-01-22 DIAGNOSIS — C61 Malignant neoplasm of prostate: Secondary | ICD-10-CM

## 2021-01-22 DIAGNOSIS — Z0389 Encounter for observation for other suspected diseases and conditions ruled out: Secondary | ICD-10-CM | POA: Diagnosis not present

## 2021-03-04 DIAGNOSIS — K219 Gastro-esophageal reflux disease without esophagitis: Secondary | ICD-10-CM | POA: Diagnosis not present

## 2021-03-04 DIAGNOSIS — G4700 Insomnia, unspecified: Secondary | ICD-10-CM | POA: Diagnosis not present

## 2021-03-04 DIAGNOSIS — E1169 Type 2 diabetes mellitus with other specified complication: Secondary | ICD-10-CM | POA: Diagnosis not present

## 2021-03-04 DIAGNOSIS — E1165 Type 2 diabetes mellitus with hyperglycemia: Secondary | ICD-10-CM | POA: Diagnosis not present

## 2021-03-04 DIAGNOSIS — E7800 Pure hypercholesterolemia, unspecified: Secondary | ICD-10-CM | POA: Diagnosis not present

## 2021-03-10 DIAGNOSIS — Z23 Encounter for immunization: Secondary | ICD-10-CM | POA: Diagnosis not present

## 2021-04-10 DIAGNOSIS — K219 Gastro-esophageal reflux disease without esophagitis: Secondary | ICD-10-CM | POA: Diagnosis not present

## 2021-04-10 DIAGNOSIS — G4700 Insomnia, unspecified: Secondary | ICD-10-CM | POA: Diagnosis not present

## 2021-04-10 DIAGNOSIS — E7800 Pure hypercholesterolemia, unspecified: Secondary | ICD-10-CM | POA: Diagnosis not present

## 2021-04-10 DIAGNOSIS — E1169 Type 2 diabetes mellitus with other specified complication: Secondary | ICD-10-CM | POA: Diagnosis not present

## 2021-04-10 DIAGNOSIS — E1165 Type 2 diabetes mellitus with hyperglycemia: Secondary | ICD-10-CM | POA: Diagnosis not present

## 2021-04-28 DIAGNOSIS — E1165 Type 2 diabetes mellitus with hyperglycemia: Secondary | ICD-10-CM | POA: Diagnosis not present

## 2021-04-28 DIAGNOSIS — G4700 Insomnia, unspecified: Secondary | ICD-10-CM | POA: Diagnosis not present

## 2021-04-28 DIAGNOSIS — E7800 Pure hypercholesterolemia, unspecified: Secondary | ICD-10-CM | POA: Diagnosis not present

## 2021-04-28 DIAGNOSIS — K219 Gastro-esophageal reflux disease without esophagitis: Secondary | ICD-10-CM | POA: Diagnosis not present

## 2021-04-28 DIAGNOSIS — E1169 Type 2 diabetes mellitus with other specified complication: Secondary | ICD-10-CM | POA: Diagnosis not present

## 2021-06-08 DIAGNOSIS — E1165 Type 2 diabetes mellitus with hyperglycemia: Secondary | ICD-10-CM | POA: Diagnosis not present

## 2021-06-08 DIAGNOSIS — G4700 Insomnia, unspecified: Secondary | ICD-10-CM | POA: Diagnosis not present

## 2021-06-08 DIAGNOSIS — E1169 Type 2 diabetes mellitus with other specified complication: Secondary | ICD-10-CM | POA: Diagnosis not present

## 2021-06-08 DIAGNOSIS — E7800 Pure hypercholesterolemia, unspecified: Secondary | ICD-10-CM | POA: Diagnosis not present

## 2021-06-08 DIAGNOSIS — K219 Gastro-esophageal reflux disease without esophagitis: Secondary | ICD-10-CM | POA: Diagnosis not present

## 2021-07-13 DIAGNOSIS — Z79899 Other long term (current) drug therapy: Secondary | ICD-10-CM | POA: Diagnosis not present

## 2021-07-13 DIAGNOSIS — E7800 Pure hypercholesterolemia, unspecified: Secondary | ICD-10-CM | POA: Diagnosis not present

## 2021-07-13 DIAGNOSIS — E1169 Type 2 diabetes mellitus with other specified complication: Secondary | ICD-10-CM | POA: Diagnosis not present

## 2021-07-15 DIAGNOSIS — E7800 Pure hypercholesterolemia, unspecified: Secondary | ICD-10-CM | POA: Diagnosis not present

## 2021-07-15 DIAGNOSIS — E1169 Type 2 diabetes mellitus with other specified complication: Secondary | ICD-10-CM | POA: Diagnosis not present

## 2021-07-15 DIAGNOSIS — R0602 Shortness of breath: Secondary | ICD-10-CM | POA: Diagnosis not present

## 2021-07-15 DIAGNOSIS — K5900 Constipation, unspecified: Secondary | ICD-10-CM | POA: Diagnosis not present

## 2021-07-15 DIAGNOSIS — Z0000 Encounter for general adult medical examination without abnormal findings: Secondary | ICD-10-CM | POA: Diagnosis not present

## 2021-07-15 DIAGNOSIS — R062 Wheezing: Secondary | ICD-10-CM | POA: Diagnosis not present

## 2021-07-15 DIAGNOSIS — Z23 Encounter for immunization: Secondary | ICD-10-CM | POA: Diagnosis not present

## 2021-07-16 DIAGNOSIS — E1169 Type 2 diabetes mellitus with other specified complication: Secondary | ICD-10-CM | POA: Diagnosis not present

## 2021-08-11 DIAGNOSIS — E1169 Type 2 diabetes mellitus with other specified complication: Secondary | ICD-10-CM | POA: Diagnosis not present

## 2021-08-11 DIAGNOSIS — E7800 Pure hypercholesterolemia, unspecified: Secondary | ICD-10-CM | POA: Diagnosis not present

## 2021-08-11 DIAGNOSIS — K219 Gastro-esophageal reflux disease without esophagitis: Secondary | ICD-10-CM | POA: Diagnosis not present

## 2021-08-11 DIAGNOSIS — G4700 Insomnia, unspecified: Secondary | ICD-10-CM | POA: Diagnosis not present

## 2021-08-18 ENCOUNTER — Other Ambulatory Visit: Payer: Self-pay

## 2021-08-18 ENCOUNTER — Encounter: Payer: Self-pay | Admitting: Pulmonary Disease

## 2021-08-18 ENCOUNTER — Ambulatory Visit: Payer: Medicare Other | Admitting: Pulmonary Disease

## 2021-08-18 VITALS — BP 116/72 | HR 85 | Ht 73.5 in | Wt 256.4 lb

## 2021-08-18 DIAGNOSIS — J329 Chronic sinusitis, unspecified: Secondary | ICD-10-CM | POA: Diagnosis not present

## 2021-08-18 DIAGNOSIS — J4520 Mild intermittent asthma, uncomplicated: Secondary | ICD-10-CM

## 2021-08-18 LAB — CBC WITH DIFFERENTIAL/PLATELET
Basophils Absolute: 0.1 10*3/uL (ref 0.0–0.1)
Basophils Relative: 0.7 % (ref 0.0–3.0)
Eosinophils Absolute: 0.4 10*3/uL (ref 0.0–0.7)
Eosinophils Relative: 5.1 % — ABNORMAL HIGH (ref 0.0–5.0)
HCT: 44.5 % (ref 39.0–52.0)
Hemoglobin: 14.9 g/dL (ref 13.0–17.0)
Lymphocytes Relative: 28.2 % (ref 12.0–46.0)
Lymphs Abs: 2.1 10*3/uL (ref 0.7–4.0)
MCHC: 33.5 g/dL (ref 30.0–36.0)
MCV: 92.1 fl (ref 78.0–100.0)
Monocytes Absolute: 0.8 10*3/uL (ref 0.1–1.0)
Monocytes Relative: 10.9 % (ref 3.0–12.0)
Neutro Abs: 4 10*3/uL (ref 1.4–7.7)
Neutrophils Relative %: 55.1 % (ref 43.0–77.0)
Platelets: 174 10*3/uL (ref 150.0–400.0)
RBC: 4.83 Mil/uL (ref 4.22–5.81)
RDW: 13.1 % (ref 11.5–15.5)
WBC: 7.3 10*3/uL (ref 4.0–10.5)

## 2021-08-18 MED ORDER — MONTELUKAST SODIUM 10 MG PO TABS: 10.0000 mg | ORAL_TABLET | Freq: Every day | ORAL | 11 refills | Status: AC

## 2021-08-18 MED ORDER — BUDESONIDE-FORMOTEROL FUMARATE 160-4.5 MCG/ACT IN AERO: 1.0000 | INHALATION_SPRAY | Freq: Two times a day (BID) | RESPIRATORY_TRACT | 6 refills | Status: AC | PRN

## 2021-08-18 NOTE — Patient Instructions (Addendum)
Start symbicort 1-2 puffs twice daily as needed  Continue to use albuterol 1-2 puffs every 4-6 hours as needed  Continue to use montelukast 10mg  daily  We will check labs today  We will schedule you for pulmonary function tests at your 6 month follow up

## 2021-08-18 NOTE — Progress Notes (Signed)
Synopsis: Referred in November 2022 for wheezing by Lawerance Cruel, MD  Subjective:   PATIENT ID: Randy Alvarez GENDER: male DOB: 1956/06/16, MRN: [id number]   HPI  Chief Complaint  Patient presents with   Consult    Referred by PCP for SOB and wheezing that occurs mostly at night. Was diagnosed with OSA and does not use a CPAP.    Randy Alvarez is a 65 year old male, former smoker with chronic sinusitis and seasonal allergies who is referred to pulmonary clinic for evaluation of intermittent wheezing.  He complains of wheezing mainly at night when he lays down for bed.  He will notice the wheezing for multiple nights which will then resolve and recur weeks later intermittently.  He reports that his seasonal allergies are worse in the spring summer and fall.  He denies any issues with cold air leading to cough, chest tightness or cough.  He denies significant shortness of breath or limitations in physical activity.  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  He has significant history of sinus disease status post multiple sinus surgeries in the past.  He continues to have sinus congestion and drainage on a regular basis.  He uses saline rinses as needed.  He is taking montelukast as needed.  He is also using Flovent 110 MCG and albuterol inhalers as needed for the episodes of chest tightness, shortness of breath and wheezing.  He is a former smoker and quit in the 1970s.  He has a history of obstructive sleep apnea but is not able to tolerate using a CPAP machine.  Past Medical History:  Diagnosis Date   Anxiety    Depression    GERD (gastroesophageal reflux disease)    Hypercholesteremia    Incomplete RBBB    Pre-diabetes    Prostate cancer ([place])      Family History  Problem Relation Age of Onset   Cancer Father    Cancer Paternal Uncle      Social History   Socioeconomic History   Marital status: Married    Spouse name: Not on file    Number of children: Not on file   Years of education: Not on file   Highest education level: Not on file  Occupational History   Not on file  Tobacco Use   Smoking status: Never   Smokeless tobacco: Never  Vaping Use   Vaping Use: Never used  Substance and Sexual Activity   Alcohol use: Yes   Drug use: Yes    Types: Marijuana   Sexual activity: Yes  Other Topics Concern   Not on file  Social History Narrative   Not on file   Social Determinants of Health   Financial Resource Strain: Not on file  Food Insecurity: Not on file  Transportation Needs: Not on file  Physical Activity: Not on file  Stress: Not on file  Social Connections: Not on file  Intimate Partner Violence: Not on file     Allergies  Allergen Reactions   Other Other (See Comments)    Mood stabilizers ex. Wellbutrin had panic attacks   Statins Other (See Comments)    "Goofy"   Percocet [Oxycodone-Acetaminophen] Rash     Outpatient Medications Prior to Visit  Medication Sig Dispense Refill   albuterol (VENTOLIN HFA) 108 (90 Base) MCG/ACT inhaler Inhale 2 puffs into the lungs every 4 (four) hours as needed.     ALPRAZolam (XANAX) 0.25 MG  tablet Take 0.25 mg by mouth 2 (two) times daily as needed.     metFORMIN (GLUCOPHAGE) 500 MG tablet Take 500 mg by mouth daily.     omeprazole (PRILOSEC) 20 MG capsule Take 20 mg by mouth daily.     solifenacin (VESICARE) 10 MG tablet Take 10 mg by mouth daily.     tamsulosin (FLOMAX) 0.4 MG CAPS capsule Take 1 tablet by mouth daily.     FLOVENT HFA 110 MCG/ACT inhaler Inhale 2 puffs into the lungs 2 (two) times daily.     montelukast (SINGULAIR) 10 MG tablet Take 10 mg by mouth daily.     aspirin EC 81 MG tablet Take 81 mg by mouth daily.     sildenafil (REVATIO) 20 MG tablet Take 20 mg by mouth as needed.      No facility-administered medications prior to visit.    Review of Systems  Constitutional:  Negative for chills, fever, malaise/fatigue and weight loss.   HENT:  Negative for congestion, sinus pain and sore throat.   Eyes: Negative.   Respiratory:  Positive for wheezing. Negative for cough, hemoptysis, sputum production and shortness of breath.   Cardiovascular:  Negative for chest pain, palpitations, orthopnea, claudication and leg swelling.  Gastrointestinal:  Negative for abdominal pain, heartburn, nausea and vomiting.  Genitourinary: Negative.   Musculoskeletal:  Negative for joint pain and myalgias.  Skin:  Negative for rash.  Neurological:  Negative for weakness.  Endo/Heme/Allergies: Negative.   Psychiatric/Behavioral: Negative.       Objective:   Vitals:   08/18/21 1500  BP: 116/72  Pulse: 85  SpO2: 96%  Weight: 256 lb 6.4 oz (116.3 kg)  Height: 6' 1.5" (1.867 m)     Physical Exam Constitutional:      General: He is not in acute distress.    Appearance: He is obese.  HENT:     Head: Normocephalic and atraumatic.  Eyes:     Extraocular Movements: Extraocular movements intact.     Conjunctiva/sclera: Conjunctivae normal.     Pupils: Pupils are equal, round, and reactive to light.  Cardiovascular:     Rate and Rhythm: Normal rate and regular rhythm.     Pulses: Normal pulses.     Heart sounds: Normal heart sounds. No murmur heard. Pulmonary:     Effort: Pulmonary effort is normal.     Breath sounds: Normal breath sounds.  Abdominal:     General: Bowel sounds are normal.     Palpations: Abdomen is soft.  Musculoskeletal:     Right lower leg: No edema.     Left lower leg: No edema.  Lymphadenopathy:     Cervical: No cervical adenopathy.  Skin:    General: Skin is warm and dry.  Neurological:     General: No focal deficit present.     Mental Status: He is alert.  Psychiatric:        Mood and Affect: Mood normal.        Behavior: Behavior normal.        Thought Content: Thought content normal.        Judgment: Judgment normal.   CBC    Component Value Date/Time   WBC 7.2 04/07/2018 1359   RBC 4.83  04/07/2018 1359   HGB 15.6 04/14/2018 1148   HCT 46.0 04/14/2018 1148   PLT 204 04/07/2018 1359   MCV 91.9 04/07/2018 1359   MCH 31.1 04/07/2018 1359   MCHC 33.8 04/07/2018 1359   RDW 13.1  04/07/2018 1359   BMP Latest Ref Rng & Units 04/14/2018 04/07/2018  Glucose 70 - 99 mg/dL 124(H) 117(H)  BUN 6 - 20 mg/dL - 16  Creatinine 0.61 - 1.24 mg/dL - 1.05  Sodium 135 - 145 mmol/L 140 139  Potassium 3.5 - 5.1 mmol/L 4.2 4.3  Chloride 101 - 111 mmol/L - 106  CO2 22 - 32 mmol/L - 25  Calcium 8.9 - 10.3 mg/dL - 9.2   Chest imaging: CXR 2019 Cardiomediastinal silhouette unchanged in size and contour. No evidence of central vascular congestion. No pneumothorax or pleural effusion. No confluent airspace disease.  PFT: No flowsheet data found.  Labs:  Path:  Echo:  Heart Catheterization:  Assessment & Plan:   Mild intermittent asthma without complication - Plan: CBC with Differential, IgE, Pulmonary Function Test  Chronic sinusitis, unspecified location - Plan: montelukast (SINGULAIR) 10 MG tablet, budesonide-formoterol (SYMBICORT) 160-4.5 MCG/ACT inhaler  Discussion: Randy Alvarez is a 65 year old male, former smoker with chronic sinusitis and seasonal allergies who is referred to pulmonary clinic for evaluation of intermittent wheezing.  He has history concerning for mild intermittent asthma with triggers that include seasonal allergies.  I have recommended that he take montelukast on a regular basis.  I will also send in prescription for Symbicort 160-21 MCG where he can take 1 to 2 puffs twice daily as needed for chest tightness, shortness of breath and wheezing.  We will check CBC with differential and IgE levels today.  We will check pulmonary function tests at follow-up in 6 months.  Freda Jackson, MD [facility] Office: [phone]   Current Outpatient Medications:    albuterol (VENTOLIN HFA) 108 (90 Base) MCG/ACT inhaler, Inhale 2 puffs into  the lungs every 4 (four) hours as needed., Disp: , Rfl:    ALPRAZolam (XANAX) 0.25 MG tablet, Take 0.25 mg by mouth 2 (two) times daily as needed., Disp: , Rfl:    budesonide-formoterol (SYMBICORT) 160-4.5 MCG/ACT inhaler, Inhale 1-2 puffs into the lungs 2 (two) times daily as needed (wheezing, chest tightness, shortness of breath)., Disp: 1 each, Rfl: 6   metFORMIN (GLUCOPHAGE) 500 MG tablet, Take 500 mg by mouth daily., Disp: , Rfl:    omeprazole (PRILOSEC) 20 MG capsule, Take 20 mg by mouth daily., Disp: , Rfl:    solifenacin (VESICARE) 10 MG tablet, Take 10 mg by mouth daily., Disp: , Rfl:    tamsulosin (FLOMAX) 0.4 MG CAPS capsule, Take 1 tablet by mouth daily., Disp: , Rfl:    montelukast (SINGULAIR) 10 MG tablet, Take 1 tablet (10 mg total) by mouth daily., Disp: 30 tablet, Rfl: 11

## 2021-08-19 ENCOUNTER — Other Ambulatory Visit (HOSPITAL_COMMUNITY): Payer: Self-pay

## 2021-08-19 ENCOUNTER — Telehealth: Payer: Self-pay | Admitting: Pharmacy Technician

## 2021-08-19 LAB — IGE: IgE (Immunoglobulin E), Serum: 85 kU/L (ref <=114)

## 2021-08-19 NOTE — Telephone Encounter (Signed)
Patient Advocate Encounter   Received notification from CoverMyMeds that prior authorization for Generic Symbicort is required by his/her insurance [employer].  Per Test Claim: No PA needed. Ins prefers brand ([email or web address])   Spoke with upstream to process. They will contact pt.   Armanda Magic, CPhT Patient [facility] Phone: [phone] Fax:  [phone]

## 2021-08-26 DIAGNOSIS — E7800 Pure hypercholesterolemia, unspecified: Secondary | ICD-10-CM | POA: Diagnosis not present

## 2021-08-26 DIAGNOSIS — G4700 Insomnia, unspecified: Secondary | ICD-10-CM | POA: Diagnosis not present

## 2021-08-26 DIAGNOSIS — E1169 Type 2 diabetes mellitus with other specified complication: Secondary | ICD-10-CM | POA: Diagnosis not present

## 2021-08-26 DIAGNOSIS — K219 Gastro-esophageal reflux disease without esophagitis: Secondary | ICD-10-CM | POA: Diagnosis not present

## 2021-08-26 DIAGNOSIS — J4520 Mild intermittent asthma, uncomplicated: Secondary | ICD-10-CM | POA: Diagnosis not present

## 2021-09-16 DIAGNOSIS — E1169 Type 2 diabetes mellitus with other specified complication: Secondary | ICD-10-CM | POA: Diagnosis not present

## 2021-09-28 DIAGNOSIS — R0602 Shortness of breath: Secondary | ICD-10-CM | POA: Diagnosis not present

## 2021-09-28 DIAGNOSIS — U071 COVID-19: Secondary | ICD-10-CM | POA: Diagnosis not present

## 2021-10-09 ENCOUNTER — Other Ambulatory Visit (HOSPITAL_COMMUNITY): Payer: Self-pay | Admitting: Urology

## 2021-10-09 DIAGNOSIS — C61 Malignant neoplasm of prostate: Secondary | ICD-10-CM

## 2021-10-16 DIAGNOSIS — E1169 Type 2 diabetes mellitus with other specified complication: Secondary | ICD-10-CM | POA: Diagnosis not present

## 2021-10-23 ENCOUNTER — Other Ambulatory Visit (HOSPITAL_COMMUNITY): Payer: Medicare Other

## 2021-11-02 ENCOUNTER — Encounter (HOSPITAL_COMMUNITY)
Admission: RE | Admit: 2021-11-02 | Discharge: 2021-11-02 | Disposition: A | Discharge status: Home / Self Care | Payer: Medicare Other | Source: Ambulatory Visit | Attending: Urology | Admitting: Urology

## 2021-11-02 ENCOUNTER — Other Ambulatory Visit: Payer: Self-pay

## 2021-11-02 DIAGNOSIS — C61 Malignant neoplasm of prostate: Secondary | ICD-10-CM | POA: Insufficient documentation

## 2021-11-02 MED ORDER — PIFLIFOLASTAT F 18 (PYLARIFY) INJECTION
9.0000 | Freq: Once | INTRAVENOUS | Status: AC
  Administered 2021-11-02: 9 via INTRAVENOUS

## 2021-11-20 DIAGNOSIS — J45901 Unspecified asthma with (acute) exacerbation: Secondary | ICD-10-CM | POA: Diagnosis not present

## 2021-11-20 DIAGNOSIS — R0602 Shortness of breath: Secondary | ICD-10-CM | POA: Diagnosis not present

## 2021-12-08 DIAGNOSIS — J45901 Unspecified asthma with (acute) exacerbation: Secondary | ICD-10-CM | POA: Diagnosis not present

## 2021-12-08 DIAGNOSIS — E7800 Pure hypercholesterolemia, unspecified: Secondary | ICD-10-CM | POA: Diagnosis not present

## 2021-12-08 DIAGNOSIS — K219 Gastro-esophageal reflux disease without esophagitis: Secondary | ICD-10-CM | POA: Diagnosis not present

## 2021-12-08 DIAGNOSIS — E1169 Type 2 diabetes mellitus with other specified complication: Secondary | ICD-10-CM | POA: Diagnosis not present

## 2021-12-15 DIAGNOSIS — E1169 Type 2 diabetes mellitus with other specified complication: Secondary | ICD-10-CM | POA: Diagnosis not present

## 2022-01-13 DIAGNOSIS — J45998 Other asthma: Secondary | ICD-10-CM | POA: Diagnosis not present

## 2022-01-13 DIAGNOSIS — B356 Tinea cruris: Secondary | ICD-10-CM | POA: Diagnosis not present

## 2022-01-13 DIAGNOSIS — E1169 Type 2 diabetes mellitus with other specified complication: Secondary | ICD-10-CM | POA: Diagnosis not present

## 2022-01-13 DIAGNOSIS — F419 Anxiety disorder, unspecified: Secondary | ICD-10-CM | POA: Diagnosis not present

## 2022-01-31 NOTE — Progress Notes (Signed)
?Cardiology Office Note:   ? ?Date:  02/04/2022  ? ?ID:  Randy Alvarez, DOB 23-May-1956, MRN [id number] ? ?PCP:  Marda Stalker, PA-C  ?Cardiologist:  None  ?Electrophysiologist:  None  ? ?Referring MD: Lawerance Cruel, MD  ? ?Chief Complaint  ?Patient presents with  ? Chest Pain  ? ? ?History of Present Illness:   ? ?Randy Alvarez is a 66 y.o. male with a hx of T2DM, hyperlipidemia, OSA, asthma, prostate cancer who is referred by Dr. Harrington Challenger for evaluation of chest pain.  He reports has been having chest pain, usually during asthma attacks.  Can occur anywhere from once per month to twice per week.  Reports pressure in center of his chest that lasts for minutes.  No clear relationship with exertion.  Reports he plays pickle ball 3-4 times per week and does dance classes with his wife and denies symptoms with this.  Does report also has dyspnea during episodes.  Denies any lightheadedness, syncope, lower extremity edema, or palpitations.  Smoked marijuana previously but quit in early 1980s.  No tobacco history.  No history of heart disease in his immediate family. ? ? ? ?Past Medical History:  ?Diagnosis Date  ? Anxiety   ? Depression   ? GERD (gastroesophageal reflux disease)   ? Hypercholesteremia   ? Incomplete RBBB   ? Pre-diabetes   ? Prostate cancer ([place])   ? ? ?Past Surgical History:  ?Procedure Laterality Date  ? APPENDECTOMY    ? COLONOSCOPY    ? descended testicle     ? age 60  ? KNEE SURGERY Right   ? nose operation    ? nose operation    ? PROSTATE BIOPSY  12/2017  ? RADIOACTIVE SEED IMPLANT N/A 04/14/2018  ? Procedure: RADIOACTIVE SEED IMPLANT/BRACHYTHERAPY IMPLANT;  Surgeon: Ceasar Mons, MD;  Location: [facility];  Service: Urology;  Laterality: N/A;  ? SPACE OAR INSTILLATION N/A 04/14/2018  ? Procedure: SPACE OAR INSTILLATION;  Surgeon: Ceasar Mons, MD;  Location: [facility];  Service: Urology;  Laterality: N/A;  ? TONSILLECTOMY     ? ? ?Current Medications: ?Current Meds  ?Medication Sig  ? diltiazem (CARDIZEM) 60 MG tablet Take 1 tablet (60 mg total) by mouth as directed. 1 time dose 2 hours prior to Coronary CT.  ? fluticasone (FLOVENT HFA) 110 MCG/ACT inhaler Inhale 2 puffs into the lungs 2 (two) times daily.  ? metFORMIN (GLUCOPHAGE) 500 MG tablet Take 500 mg by mouth daily.  ? omeprazole (PRILOSEC) 20 MG capsule Take 20 mg by mouth daily.  ? rosuvastatin (CRESTOR) 20 MG tablet Take 20 mg by mouth daily.  ? solifenacin (VESICARE) 10 MG tablet Take 10 mg by mouth daily.  ? tamsulosin (FLOMAX) 0.4 MG CAPS capsule Take 1 tablet by mouth daily.  ?  ? ?Allergies:   Other, Statins, and Percocet [oxycodone-acetaminophen]  ? ?Social History  ? ?Socioeconomic History  ? Marital status: Married  ?  Spouse name: Not on file  ? Number of children: Not on file  ? Years of education: Not on file  ? Highest education level: Not on file  ?Occupational History  ? Not on file  ?Tobacco Use  ? Smoking status: Former  ?  Types: Cigarettes  ?  Quit date: 75  ?  Years since quitting: 43.3  ? Smokeless tobacco: Never  ?Vaping Use  ? Vaping Use: Never used  ?Substance and Sexual Activity  ? Alcohol use: Yes  ?  Drug use: Yes  ?  Types: Marijuana  ? Sexual activity: Yes  ?Other Topics Concern  ? Not on file  ?Social History Narrative  ? Not on file  ? ?Social Determinants of Health  ? ?Financial Resource Strain: Not on file  ?Food Insecurity: Not on file  ?Transportation Needs: Not on file  ?Physical Activity: Not on file  ?Stress: Not on file  ?Social Connections: Not on file  ?  ? ?Family History: ?The patient's family history includes Cancer in his father and paternal uncle. ? ?ROS:   ?Please see the history of present illness.    ? All other systems reviewed and are negative. ? ?EKGs/Labs/Other Studies Reviewed:   ? ?The following studies were reviewed today: ? ? ?EKG:   ?02/04/2022: Normal sinus rhythm, rate 75, incomplete right bundle branch block ? ?Recent  Labs: ?08/18/2021: Hemoglobin 14.9; Platelets 174.0  ?Recent Lipid Panel ?No results found for: CHOL, TRIG, HDL, CHOLHDL, VLDL, LDLCALC, LDLDIRECT ? ?Physical Exam:   ? ?VS:  BP 100/70 (BP Location: Left Arm, Patient Position: Sitting, Cuff Size: Large)   Pulse 75   Ht '6\' 1"'$  (1.854 m)   Wt 258 lb 6.4 oz (117.2 kg)   SpO2 94%   BMI 34.09 kg/m?    ? ?Wt Readings from Last 3 Encounters:  ?02/04/22 258 lb 6.4 oz (117.2 kg)  ?08/18/21 256 lb 6.4 oz (116.3 kg)  ?05/05/18 258 lb 12.8 oz (117.4 kg)  ?  ? ?GEN:  Well nourished, well developed in no acute distress ?HEENT: Normal ?NECK: No JVD; No carotid bruits ?LYMPHATICS: No lymphadenopathy ?CARDIAC: RRR, no murmurs, rubs, gallops ?RESPIRATORY:  Clear to auscultation without rales, wheezing or rhonchi  ?ABDOMEN: Soft, non-tender, non-distended ?MUSCULOSKELETAL:  No edema; No deformity  ?SKIN: Warm and dry ?NEUROLOGIC:  Alert and oriented x 3 ?PSYCHIATRIC:  Normal affect  ? ?ASSESSMENT:   ? ?1. Chest pain, unspecified type   ?2. Hyperlipidemia, unspecified hyperlipidemia type   ?3. OSA (obstructive sleep apnea)   ? ?PLAN:   ? ?Chest pain: Atypical in description but does have significant CAD risk factors (age, T2DM, hyperlipidemia).  Recommend coronary CTA to evaluate for obstructive CAD.  Given his issues with asthma, will avoid beta blockers and give diltiazem 60 mg prior to study.  Check echocardiogram to rule out structural heart disease. ? ?T2DM: On metformin.  A1c 6.7% 01/13/2022 ? ?Hyperlipidemia: On rosuvastatin 5 mg daily.  LDL 110 07/13/2021.  Reports has not been compliant with statin, encourage compliance ? ?OSA: could not tolerate CPAP.  Reports he is considering Inspire device ? ?RTC in 6 months ? ? ?Medication Adjustments/Labs and Tests Ordered: ?Current medicines are reviewed at length with the patient today.  Concerns regarding medicines are outlined above.  ?Orders Placed This Encounter  ?Procedures  ? CT CORONARY MORPH W/CTA COR W/SCORE W/CA W/CM &/OR  WO/CM  ? Basic Metabolic Panel (BMET)  ? EKG 12-Lead  ? ECHOCARDIOGRAM COMPLETE  ? ?Meds ordered this encounter  ?Medications  ? diltiazem (CARDIZEM) 60 MG tablet  ?  Sig: Take 1 tablet (60 mg total) by mouth as directed. 1 time dose 2 hours prior to Coronary CT.  ?  Dispense:  1 tablet  ?  Refill:  0  ? ? ?Patient Instructions  ?Medication Instructions:  ? ?Your physician recommends that you continue on your current medications as directed. Please refer to the Current Medication list given to you today. ? ? ?*If you need a refill on your cardiac  medications before your next appointment, please call your pharmacy* ? ? ?Lab Work: ? ?TODAY!!!! BMET ? ?If you have labs (blood work) drawn today and your tests are completely normal, you will receive your results only by: ?MyChart Message (if you have MyChart) OR ?A paper copy in the mail ?If you have any lab test that is abnormal or we need to change your treatment, we will call you to review the results. ? ? ?Testing/Procedures: ? ?Your physician has requested that you have an echocardiogram. Echocardiography is a painless test that uses sound waves to create images of your heart. It provides your doctor with information about the size and shape of your heart and how well your heart?s chambers and valves are working. This procedure takes approximately one hour. There are no restrictions for this procedure. ? ? ? ?Your cardiac CT will be scheduled at one of the below locations:  ? ?[facility] ?[address] ?[place], [place] [address] ?[phone] ? ?If scheduled at [facility], please arrive at the [facility] and Children's Entrance (Entrance C2) of [facility] 30 minutes prior to test start time. ?You can use the FREE valet parking offered at entrance C (encouraged to control the heart rate for the test)  ?Proceed to the [facility] Radiology Department (first floor) to check-in and test prep. ? ?All radiology patients and guests should use  entrance C2 at [facility], accessed from [facility], even though the hospital's physical address listed is [address] ? ? ? ?If scheduled at [facility],

## 2022-02-04 ENCOUNTER — Encounter: Payer: Self-pay | Admitting: Cardiology

## 2022-02-04 ENCOUNTER — Ambulatory Visit: Payer: Medicare Other | Admitting: Cardiology

## 2022-02-04 VITALS — BP 100/70 | HR 75 | Ht 73.0 in | Wt 258.4 lb

## 2022-02-04 DIAGNOSIS — R079 Chest pain, unspecified: Secondary | ICD-10-CM | POA: Diagnosis not present

## 2022-02-04 DIAGNOSIS — G4733 Obstructive sleep apnea (adult) (pediatric): Secondary | ICD-10-CM | POA: Diagnosis not present

## 2022-02-04 DIAGNOSIS — E785 Hyperlipidemia, unspecified: Secondary | ICD-10-CM | POA: Diagnosis not present

## 2022-02-04 MED ORDER — DILTIAZEM HCL 60 MG PO TABS: 60.0000 mg | ORAL_TABLET | ORAL | 0 refills | Status: AC

## 2022-02-04 NOTE — Patient Instructions (Signed)
Medication Instructions:  ? ?Your physician recommends that you continue on your current medications as directed. Please refer to the Current Medication list given to you today. ? ? ?*If you need a refill on your cardiac medications before your next appointment, please call your pharmacy* ? ? ?Lab Work: ? ?TODAY!!!! BMET ? ?If you have labs (blood work) drawn today and your tests are completely normal, you will receive your results only by: ?MyChart Message (if you have MyChart) OR ?A paper copy in the mail ?If you have any lab test that is abnormal or we need to change your treatment, we will call you to review the results. ? ? ?Testing/Procedures: ? ?Your physician has requested that you have an echocardiogram. Echocardiography is a painless test that uses sound waves to create images of your heart. It provides your doctor with information about the size and shape of your heart and how well your heart?s chambers and valves are working. This procedure takes approximately one hour. There are no restrictions for this procedure. ? ? ? ?Your cardiac CT will be scheduled at one of the below locations:  ? ?[facility] ?[address] ?[place], [place] [address] ?[phone] ? ?If scheduled at [facility], please arrive at the [facility] and Children's Entrance (Entrance C2) of [facility] 30 minutes prior to test start time. ?You can use the FREE valet parking offered at entrance C (encouraged to control the heart rate for the test)  ?Proceed to the [facility] Radiology Department (first floor) to check-in and test prep. ? ?All radiology patients and guests should use entrance C2 at [facility], accessed from [facility], even though the hospital's physical address listed is [address] ? ? ? ?If scheduled at [facility], please arrive 15 mins early for check-in and test prep. ? ?Please follow these instructions carefully (unless  otherwise directed): ? ?Hold all erectile dysfunction medications at least 3 days (72 hrs) prior to test. ? ?On the Night Before the Test: ?Be sure to Drink plenty of water. ?Do not consume any caffeinated/decaffeinated beverages or chocolate 12 hours prior to your test. ?Do not take any antihistamines 12 hours prior to your test. ? ? ?On the Day of the Test: ?Drink plenty of water until 1 hour prior to the test. ?Do not eat any food 4 hours prior to the test. ?You may take your regular medications prior to the test.  ?Take one time dose of diltiazem (60 mg) 2 hours prior to test.  ?     ?After the Test: ?Drink plenty of water. ?After receiving IV contrast, you may experience a mild flushed feeling. This is normal. ?On occasion, you may experience a mild rash up to 24 hours after the test. This is not dangerous. If this occurs, you can take Benadryl 25 mg and increase your fluid intake. ?If you experience trouble breathing, this can be serious. If it is severe call 911 IMMEDIATELY. If it is mild, please call our office. ?If you take any of these medications: Metformin,  please do not take 48 hours after completing test unless otherwise instructed. ? ?We will call to schedule your test 2-4 weeks out understanding that some insurance companies will need an authorization prior to the service being performed.  ? ?For non-scheduling related questions, please contact the cardiac imaging nurse navigator should you have any questions/concerns: ?Marchia Bond, Cardiac Imaging Nurse Navigator ?Gordy Clement, Cardiac Imaging Nurse Navigator ?[facility] ?Direct Office Dial: [phone]  ? ?For scheduling needs, including cancellations and rescheduling, please call [place], [phone].  ? ? ?Follow-Up: ?At [facility], you and your health needs are our priority.  As part of our continuing mission to provide you with exceptional heart care, we have created designated Provider Care Teams.  These  Care Teams include your primary Cardiologist (physician) and Advanced Practice Providers (APPs -  Physician Assistants and Nurse Practitioners) who all work together to provide you with the care you need, when you need it. ? ?We recommend signing up for the patient portal called "MyChart".  Sign up information is provided on this After Visit Summary.  MyChart is used to connect with patients for Virtual Visits (Telemedicine).  Patients are able to view lab/test results, encounter notes, upcoming appointments, etc.  Non-urgent messages can be sent to your provider as well.   ?To learn more about what you can do with MyChart, go to [email or web address].   ? ?Your next appointment:   ?6 month(s) ? ?The format for your next appointment:   ?In Person ? ?Provider:   ?None   ? ? ?Important Information About Sugar ? ? ? ? ?  ?

## 2022-02-05 LAB — BASIC METABOLIC PANEL
BUN/Creatinine Ratio: 11 (ref 10–24)
BUN: 11 mg/dL (ref 8–27)
CO2: 20 mmol/L (ref 20–29)
Calcium: 9.1 mg/dL (ref 8.6–10.2)
Chloride: 105 mmol/L (ref 96–106)
Creatinine, Ser: 0.97 mg/dL (ref 0.76–1.27)
Glucose: 112 mg/dL — ABNORMAL HIGH (ref 70–99)
Potassium: 4.4 mmol/L (ref 3.5–5.2)
Sodium: 140 mmol/L (ref 134–144)
eGFR: 86 mL/min/{1.73_m2} (ref >59)

## 2022-02-17 ENCOUNTER — Telehealth (HOSPITAL_COMMUNITY): Payer: Self-pay | Admitting: *Deleted

## 2022-02-17 NOTE — Telephone Encounter (Signed)
Attempted to call patient regarding upcoming cardiac CT appointment. °Left message on voicemail with name and callback number ° °Caedon Bond RN Navigator Cardiac Imaging °[facility] °[phone] Office °[phone] Cell ° °

## 2022-02-18 ENCOUNTER — Other Ambulatory Visit: Payer: Self-pay | Admitting: Cardiology

## 2022-02-18 ENCOUNTER — Encounter (HOSPITAL_COMMUNITY): Payer: Self-pay

## 2022-02-18 ENCOUNTER — Ambulatory Visit (HOSPITAL_COMMUNITY)
Admission: RE | Admit: 2022-02-18 | Discharge: 2022-02-18 | Disposition: A | Discharge status: Home / Self Care | Payer: Medicare Other | Source: Ambulatory Visit | Attending: Cardiology | Admitting: Cardiology

## 2022-02-18 DIAGNOSIS — R931 Abnormal findings on diagnostic imaging of heart and coronary circulation: Secondary | ICD-10-CM

## 2022-02-18 DIAGNOSIS — I25119 Atherosclerotic heart disease of native coronary artery with unspecified angina pectoris: Secondary | ICD-10-CM | POA: Insufficient documentation

## 2022-02-18 DIAGNOSIS — I2510 Atherosclerotic heart disease of native coronary artery without angina pectoris: Secondary | ICD-10-CM | POA: Diagnosis not present

## 2022-02-18 DIAGNOSIS — R079 Chest pain, unspecified: Secondary | ICD-10-CM | POA: Diagnosis not present

## 2022-02-18 MED ORDER — METOPROLOL TARTRATE 5 MG/5ML IV SOLN
INTRAVENOUS | Status: AC
  Filled 2022-02-18: qty 20

## 2022-02-18 MED ORDER — NITROGLYCERIN 0.4 MG SL SUBL
0.8000 mg | SUBLINGUAL_TABLET | Freq: Once | SUBLINGUAL | Status: AC
  Administered 2022-02-18: 0.8 mg via SUBLINGUAL

## 2022-02-18 MED ORDER — NITROGLYCERIN 0.4 MG SL SUBL
SUBLINGUAL_TABLET | SUBLINGUAL | Status: AC
  Filled 2022-02-18: qty 2

## 2022-02-18 MED ORDER — IOHEXOL 350 MG/ML SOLN
100.0000 mL | Freq: Once | INTRAVENOUS | Status: AC | PRN
  Administered 2022-02-18: 100 mL via INTRAVENOUS

## 2022-02-19 ENCOUNTER — Other Ambulatory Visit: Payer: Self-pay | Admitting: *Deleted

## 2022-02-19 ENCOUNTER — Ambulatory Visit (HOSPITAL_BASED_OUTPATIENT_CLINIC_OR_DEPARTMENT_OTHER)
Admission: RE | Admit: 2022-02-19 | Discharge: 2022-02-19 | Disposition: A | Discharge status: Home / Self Care | Payer: Medicare Other | Source: Ambulatory Visit | Attending: Cardiology | Admitting: Cardiology

## 2022-02-19 DIAGNOSIS — R079 Chest pain, unspecified: Secondary | ICD-10-CM | POA: Diagnosis not present

## 2022-02-19 DIAGNOSIS — R931 Abnormal findings on diagnostic imaging of heart and coronary circulation: Secondary | ICD-10-CM

## 2022-02-19 MED ORDER — ROSUVASTATIN CALCIUM 10 MG PO TABS: 10.0000 mg | ORAL_TABLET | Freq: Every day | ORAL | 3 refills | Status: AC

## 2022-02-22 ENCOUNTER — Ambulatory Visit (HOSPITAL_COMMUNITY): Payer: Medicare Other | Attending: Cardiology

## 2022-02-22 DIAGNOSIS — R079 Chest pain, unspecified: Secondary | ICD-10-CM | POA: Diagnosis not present

## 2022-02-22 LAB — ECHOCARDIOGRAM COMPLETE
Area-P 1/2: 2.9 cm2
P 1/2 time: 330 msec
S' Lateral: 3 cm

## 2022-03-01 DIAGNOSIS — C61 Malignant neoplasm of prostate: Secondary | ICD-10-CM | POA: Diagnosis not present

## 2022-03-01 DIAGNOSIS — R972 Elevated prostate specific antigen [PSA]: Secondary | ICD-10-CM | POA: Diagnosis not present

## 2022-03-12 ENCOUNTER — Other Ambulatory Visit: Payer: Self-pay | Admitting: *Deleted

## 2022-03-12 DIAGNOSIS — I77819 Aortic ectasia, unspecified site: Secondary | ICD-10-CM

## 2022-03-24 ENCOUNTER — Other Ambulatory Visit (HOSPITAL_COMMUNITY): Payer: Self-pay | Admitting: Urology

## 2022-03-24 DIAGNOSIS — R9721 Rising PSA following treatment for malignant neoplasm of prostate: Secondary | ICD-10-CM

## 2022-04-05 ENCOUNTER — Encounter (HOSPITAL_COMMUNITY)
Admission: RE | Admit: 2022-04-05 | Discharge: 2022-04-05 | Disposition: A | Discharge status: Home / Self Care | Payer: Medicare Other | Source: Ambulatory Visit | Attending: Urology | Admitting: Urology

## 2022-04-05 DIAGNOSIS — K8020 Calculus of gallbladder without cholecystitis without obstruction: Secondary | ICD-10-CM | POA: Diagnosis not present

## 2022-04-05 DIAGNOSIS — K439 Ventral hernia without obstruction or gangrene: Secondary | ICD-10-CM | POA: Diagnosis not present

## 2022-04-05 DIAGNOSIS — R9721 Rising PSA following treatment for malignant neoplasm of prostate: Secondary | ICD-10-CM | POA: Diagnosis not present

## 2022-04-05 DIAGNOSIS — C61 Malignant neoplasm of prostate: Secondary | ICD-10-CM | POA: Diagnosis not present

## 2022-04-05 DIAGNOSIS — H524 Presbyopia: Secondary | ICD-10-CM | POA: Diagnosis not present

## 2022-04-05 DIAGNOSIS — E119 Type 2 diabetes mellitus without complications: Secondary | ICD-10-CM | POA: Diagnosis not present

## 2022-04-05 MED ORDER — PIFLIFOLASTAT F 18 (PYLARIFY) INJECTION
9.0000 | Freq: Once | INTRAVENOUS | Status: AC
  Administered 2022-04-05: 8.63 via INTRAVENOUS

## 2022-04-12 DIAGNOSIS — H524 Presbyopia: Secondary | ICD-10-CM | POA: Diagnosis not present

## 2022-06-15 DIAGNOSIS — G4733 Obstructive sleep apnea (adult) (pediatric): Secondary | ICD-10-CM | POA: Diagnosis not present

## 2022-07-23 DIAGNOSIS — E1169 Type 2 diabetes mellitus with other specified complication: Secondary | ICD-10-CM | POA: Diagnosis not present

## 2022-07-23 DIAGNOSIS — E7800 Pure hypercholesterolemia, unspecified: Secondary | ICD-10-CM | POA: Diagnosis not present

## 2022-07-28 DIAGNOSIS — Z0000 Encounter for general adult medical examination without abnormal findings: Secondary | ICD-10-CM | POA: Diagnosis not present

## 2022-08-05 DIAGNOSIS — F411 Generalized anxiety disorder: Secondary | ICD-10-CM | POA: Diagnosis not present

## 2022-08-05 DIAGNOSIS — Z0000 Encounter for general adult medical examination without abnormal findings: Secondary | ICD-10-CM | POA: Diagnosis not present

## 2022-08-05 DIAGNOSIS — E1169 Type 2 diabetes mellitus with other specified complication: Secondary | ICD-10-CM | POA: Diagnosis not present

## 2022-08-05 DIAGNOSIS — E7800 Pure hypercholesterolemia, unspecified: Secondary | ICD-10-CM | POA: Diagnosis not present

## 2022-08-11 DIAGNOSIS — E7800 Pure hypercholesterolemia, unspecified: Secondary | ICD-10-CM | POA: Diagnosis not present

## 2022-08-11 DIAGNOSIS — K219 Gastro-esophageal reflux disease without esophagitis: Secondary | ICD-10-CM | POA: Diagnosis not present

## 2022-08-11 DIAGNOSIS — E1169 Type 2 diabetes mellitus with other specified complication: Secondary | ICD-10-CM | POA: Diagnosis not present

## 2022-08-11 DIAGNOSIS — N400 Enlarged prostate without lower urinary tract symptoms: Secondary | ICD-10-CM | POA: Diagnosis not present

## 2022-08-16 ENCOUNTER — Ambulatory Visit: Payer: Medicare Other | Admitting: Cardiology

## 2022-08-23 DIAGNOSIS — R9721 Rising PSA following treatment for malignant neoplasm of prostate: Secondary | ICD-10-CM | POA: Diagnosis not present

## 2022-08-30 DIAGNOSIS — C61 Malignant neoplasm of prostate: Secondary | ICD-10-CM | POA: Diagnosis not present

## 2022-09-24 DIAGNOSIS — L570 Actinic keratosis: Secondary | ICD-10-CM | POA: Diagnosis not present

## 2022-10-06 DIAGNOSIS — J343 Hypertrophy of nasal turbinates: Secondary | ICD-10-CM | POA: Diagnosis not present

## 2022-10-06 DIAGNOSIS — Z6834 Body mass index (BMI) 34.0-34.9, adult: Secondary | ICD-10-CM | POA: Diagnosis not present

## 2022-10-06 DIAGNOSIS — G4733 Obstructive sleep apnea (adult) (pediatric): Secondary | ICD-10-CM | POA: Diagnosis not present

## 2022-11-05 IMAGING — CT NM PET TUM IMG SKULL BASE T - THIGH
1 of 7 series · 1 of 25 positions shown · non-contrast
Comparison: PSMA PET scan 11/02/2021

CLINICAL DATA: Prostate cancer with radioactive seed implants 1304.
PSA equal 5.7 on 02/27/2022

EXAM:
NUCLEAR MEDICINE PET SKULL BASE TO THIGH
TECHNIQUE: 8.6 mCi F18 Piflufolastat (Pylarify) was injected intravenously.
Full-ring PET imaging was performed from the skull base to thigh
after the radiotracer. CT data was obtained and used for attenuation
correction and anatomic localization.

[Series 3: pet sk_thigh ac · axial · 5.0mm · 4.07mm/px · 1 of 275 slices shown]
[im 165/275]
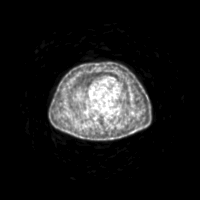

[1 of 25 positions shown; findings below may reference images not displayed]

FINDINGS: NECK

No radiotracer activity in neck lymph nodes.

Incidental CT finding: None

CHEST

No radiotracer accumulation within mediastinal or hilar lymph nodes.
No suspicious pulmonary nodules on the CT scan.

Incidental CT finding: None

ABDOMEN/PELVIS

Prostate: Intense activity in the RIGHT lobe of the prostate gland
unchanged from comparison exam with SUV max equal 20.1 compared to
SUV max equal 20.0. Brachytherapy seeds within the gland.

Lymph nodes: No abnormal radiotracer accumulation within pelvic or
abdominal nodes.

Liver: No evidence of liver metastasis

Incidental CT finding: Several gallstones. No gallbladder
inflammation. Small midline ventral hernia.

SKELETON

No focal  activity to suggest skeletal metastasis.
IMPRESSION: 1. Persistent intense radiotracer activity RIGHT lobe of prostate
gland most consistent with prostate cancer recurrence. No change
from prior PSMA PET scan.
2. No metastatic adenopathy in the pelvis or periaortic
retroperitoneum.
3. No visceral metastasis or skeletal metastasis.

## 2022-11-26 DIAGNOSIS — D225 Melanocytic nevi of trunk: Secondary | ICD-10-CM | POA: Diagnosis not present

## 2022-11-26 DIAGNOSIS — L814 Other melanin hyperpigmentation: Secondary | ICD-10-CM | POA: Diagnosis not present

## 2022-11-26 DIAGNOSIS — L821 Other seborrheic keratosis: Secondary | ICD-10-CM | POA: Diagnosis not present

## 2022-12-27 ENCOUNTER — Other Ambulatory Visit: Payer: Self-pay | Admitting: *Deleted

## 2022-12-27 DIAGNOSIS — I77819 Aortic ectasia, unspecified site: Secondary | ICD-10-CM

## 2023-01-13 DIAGNOSIS — J45998 Other asthma: Secondary | ICD-10-CM | POA: Diagnosis not present

## 2023-01-13 DIAGNOSIS — M25561 Pain in right knee: Secondary | ICD-10-CM | POA: Diagnosis not present

## 2023-01-24 DIAGNOSIS — M25561 Pain in right knee: Secondary | ICD-10-CM | POA: Diagnosis not present

## 2023-02-16 ENCOUNTER — Other Ambulatory Visit (HOSPITAL_COMMUNITY): Payer: Self-pay | Admitting: Urology

## 2023-02-16 DIAGNOSIS — R9721 Rising PSA following treatment for malignant neoplasm of prostate: Secondary | ICD-10-CM

## 2023-03-23 ENCOUNTER — Ambulatory Visit (HOSPITAL_COMMUNITY): Payer: Medicare Other

## 2023-04-19 ENCOUNTER — Ambulatory Visit (HOSPITAL_COMMUNITY): Payer: Medicare Other

## 2023-04-19 ENCOUNTER — Encounter (HOSPITAL_COMMUNITY): Payer: Self-pay

## 2023-05-16 DIAGNOSIS — C61 Malignant neoplasm of prostate: Secondary | ICD-10-CM | POA: Diagnosis not present

## 2023-05-17 DIAGNOSIS — J45998 Other asthma: Secondary | ICD-10-CM | POA: Diagnosis not present

## 2023-05-19 DIAGNOSIS — Z888 Allergy status to other drugs, medicaments and biological substances status: Secondary | ICD-10-CM | POA: Diagnosis not present

## 2023-05-19 DIAGNOSIS — M1990 Unspecified osteoarthritis, unspecified site: Secondary | ICD-10-CM | POA: Diagnosis not present

## 2023-05-19 DIAGNOSIS — I10 Essential (primary) hypertension: Secondary | ICD-10-CM | POA: Diagnosis not present

## 2023-05-19 DIAGNOSIS — K219 Gastro-esophageal reflux disease without esophagitis: Secondary | ICD-10-CM | POA: Diagnosis not present

## 2023-05-19 DIAGNOSIS — Z6833 Body mass index (BMI) 33.0-33.9, adult: Secondary | ICD-10-CM | POA: Diagnosis not present

## 2023-05-19 DIAGNOSIS — Z8546 Personal history of malignant neoplasm of prostate: Secondary | ICD-10-CM | POA: Diagnosis not present

## 2023-05-19 DIAGNOSIS — E785 Hyperlipidemia, unspecified: Secondary | ICD-10-CM | POA: Diagnosis not present

## 2023-05-19 DIAGNOSIS — F419 Anxiety disorder, unspecified: Secondary | ICD-10-CM | POA: Diagnosis not present

## 2023-05-23 DIAGNOSIS — H35363 Drusen (degenerative) of macula, bilateral: Secondary | ICD-10-CM | POA: Diagnosis not present

## 2023-06-16 DIAGNOSIS — J45998 Other asthma: Secondary | ICD-10-CM | POA: Diagnosis not present

## 2023-06-24 DIAGNOSIS — H35363 Drusen (degenerative) of macula, bilateral: Secondary | ICD-10-CM | POA: Diagnosis not present

## 2023-08-01 DIAGNOSIS — E119 Type 2 diabetes mellitus without complications: Secondary | ICD-10-CM | POA: Diagnosis not present

## 2023-08-01 DIAGNOSIS — I700 Atherosclerosis of aorta: Secondary | ICD-10-CM | POA: Diagnosis not present

## 2023-08-01 DIAGNOSIS — Z0000 Encounter for general adult medical examination without abnormal findings: Secondary | ICD-10-CM | POA: Diagnosis not present

## 2023-08-01 DIAGNOSIS — E7800 Pure hypercholesterolemia, unspecified: Secondary | ICD-10-CM | POA: Diagnosis not present

## 2023-08-01 DIAGNOSIS — E1169 Type 2 diabetes mellitus with other specified complication: Secondary | ICD-10-CM | POA: Diagnosis not present

## 2023-08-01 DIAGNOSIS — I77810 Thoracic aortic ectasia: Secondary | ICD-10-CM | POA: Diagnosis not present

## 2023-08-01 DIAGNOSIS — C61 Malignant neoplasm of prostate: Secondary | ICD-10-CM | POA: Diagnosis not present

## 2023-08-09 DIAGNOSIS — Z1331 Encounter for screening for depression: Secondary | ICD-10-CM | POA: Diagnosis not present

## 2023-08-09 DIAGNOSIS — Z0000 Encounter for general adult medical examination without abnormal findings: Secondary | ICD-10-CM | POA: Diagnosis not present

## 2024-01-17 DIAGNOSIS — D123 Benign neoplasm of transverse colon: Secondary | ICD-10-CM | POA: Diagnosis not present

## 2024-01-17 DIAGNOSIS — Z09 Encounter for follow-up examination after completed treatment for conditions other than malignant neoplasm: Secondary | ICD-10-CM | POA: Diagnosis not present

## 2024-01-17 DIAGNOSIS — D122 Benign neoplasm of ascending colon: Secondary | ICD-10-CM | POA: Diagnosis not present

## 2024-01-17 DIAGNOSIS — Z860101 Personal history of adenomatous and serrated colon polyps: Secondary | ICD-10-CM | POA: Diagnosis not present

## 2024-01-17 DIAGNOSIS — K5730 Diverticulosis of large intestine without perforation or abscess without bleeding: Secondary | ICD-10-CM | POA: Diagnosis not present

## 2024-01-19 DIAGNOSIS — D123 Benign neoplasm of transverse colon: Secondary | ICD-10-CM | POA: Diagnosis not present

## 2024-01-19 DIAGNOSIS — D122 Benign neoplasm of ascending colon: Secondary | ICD-10-CM | POA: Diagnosis not present

## 2024-01-24 DIAGNOSIS — Z658 Other specified problems related to psychosocial circumstances: Secondary | ICD-10-CM | POA: Diagnosis not present

## 2024-01-24 DIAGNOSIS — G479 Sleep disorder, unspecified: Secondary | ICD-10-CM | POA: Diagnosis not present

## 2024-01-24 DIAGNOSIS — F419 Anxiety disorder, unspecified: Secondary | ICD-10-CM | POA: Diagnosis not present

## 2024-01-24 DIAGNOSIS — T887XXA Unspecified adverse effect of drug or medicament, initial encounter: Secondary | ICD-10-CM | POA: Diagnosis not present

## 2024-01-24 DIAGNOSIS — Z6832 Body mass index (BMI) 32.0-32.9, adult: Secondary | ICD-10-CM | POA: Diagnosis not present

## 2024-02-06 DIAGNOSIS — F419 Anxiety disorder, unspecified: Secondary | ICD-10-CM | POA: Diagnosis not present

## 2024-02-06 DIAGNOSIS — C61 Malignant neoplasm of prostate: Secondary | ICD-10-CM | POA: Diagnosis not present

## 2024-02-06 DIAGNOSIS — E1169 Type 2 diabetes mellitus with other specified complication: Secondary | ICD-10-CM | POA: Diagnosis not present

## 2024-02-06 DIAGNOSIS — R5383 Other fatigue: Secondary | ICD-10-CM | POA: Diagnosis not present

## 2024-02-06 DIAGNOSIS — Z6832 Body mass index (BMI) 32.0-32.9, adult: Secondary | ICD-10-CM | POA: Diagnosis not present

## 2024-02-27 DIAGNOSIS — M25512 Pain in left shoulder: Secondary | ICD-10-CM | POA: Diagnosis not present

## 2024-03-01 DIAGNOSIS — C61 Malignant neoplasm of prostate: Secondary | ICD-10-CM | POA: Diagnosis not present

## 2024-03-07 ENCOUNTER — Ambulatory Visit: Admitting: Orthopaedic Surgery

## 2024-04-16 ENCOUNTER — Encounter: Payer: Self-pay | Admitting: Podiatry

## 2024-04-16 ENCOUNTER — Ambulatory Visit (INDEPENDENT_AMBULATORY_CARE_PROVIDER_SITE_OTHER)

## 2024-04-16 ENCOUNTER — Ambulatory Visit (INDEPENDENT_AMBULATORY_CARE_PROVIDER_SITE_OTHER): Admitting: Podiatry

## 2024-04-16 DIAGNOSIS — M21611 Bunion of right foot: Secondary | ICD-10-CM

## 2024-04-16 DIAGNOSIS — M2012 Hallux valgus (acquired), left foot: Secondary | ICD-10-CM

## 2024-04-16 DIAGNOSIS — M2011 Hallux valgus (acquired), right foot: Secondary | ICD-10-CM

## 2024-04-16 DIAGNOSIS — M21612 Bunion of left foot: Secondary | ICD-10-CM | POA: Diagnosis not present

## 2024-04-16 DIAGNOSIS — E1142 Type 2 diabetes mellitus with diabetic polyneuropathy: Secondary | ICD-10-CM

## 2024-04-16 MED ORDER — GABAPENTIN 300 MG PO CAPS: ORAL_CAPSULE | ORAL | 0 refills | Status: DC

## 2024-04-16 NOTE — Progress Notes (Signed)
 Subjective:  Patient ID: Randy Alvarez, male    DOB: 1956-08-04,  MRN: [id number]  Chief Complaint  Patient presents with   Foot Pain    I think I have Arthritis or Neuropathy or both.  My toes hurt.  It's both feet, I get tingling and burning.    Discussed the use of AI scribe software for clinical note transcription with the patient, who gave verbal consent to proceed.  History of Present Illness Randy Alvarez is a 68 year old male with diabetes who presents with severe foot pain.  He experiences severe burning and aching pain in his heels, back of the feet, and toes. The pain affects his ability to feel the gas pedals and brakes while driving, and his toes sometimes curl down when walking.  He is currently on Ozempic for diabetes management but does not recall his recent A1c levels. He has not been on medications like gabapentin or Lyrica for nerve pain. He uses Epsom salts and ice for pain relief, which provides some relief.  He has a family history of foot issues, mentioning that he inherited bunions from his mother. He enjoys walking but finds it painful due to his current symptoms.  He consumes approximately eight beers per week. No low back issues or pinched nerves. He is concerned about the potential for worsening symptoms and the impact on his daily activities, including his enjoyment of walking.      Objective:    Physical Exam VASCULAR: DP and PT pulse palpable. Foot is warm and well-perfused. Capillary fill time is brisk. DERMATOLOGIC: Normal skin turgor, texture, and temperature. No open lesions, rashes, or ulcerations. NEUROLOGIC: Lack of monofilament appreciation to the mid lower leg. Vibratory sensation intact. Normal sensation to light touch and pressure. No paresthesias. ORTHOPEDIC: Hallux valgus deformity with good range of motion of the MTP, midtarsal, and ankle joints.  No pain with manipulation or worsening pain with manipulation of the MTP midfoot  or plantar fascia to indicate this is the primary issue   No images are attached to the encounter.    Results RADIOLOGY Foot X-ray: Mild midtarsal arthritis bilaterally, moderate to severe arthritis of the first MTP joint on the left great toe (04/16/2024)   Assessment:   1. Hallux valgus with bunions, right   2. Hallux valgus with bunions, left   3. Diabetic peripheral neuropathy (HCC)      Plan:  Patient was evaluated and treated and all questions answered.  Assessment and Plan Assessment & Plan Diabetic polyneuropathy Significant neuropathy with decreased peripheral sensation from mid-leg down, consistent with diabetic polyneuropathy. Symptoms include severe pain, burning, and tight sensations in the feet, particularly at night. Likely related to diabetes, though hereditary and age-related factors are possible. Differential includes vitamin B12 and folate deficiency. - Prescribe gabapentin 300 mg at night, with potential to increase dosage based on response and side effects. Risks include grogginess, drowsiness, dizziness, and balance issues, particularly in older patients. - Order lab tests for vitamin B12 and folate levels at [facility]. - Advise on daily foot inspection and avoiding barefoot walking to prevent injuries. - Educate on the importance of blood sugar control to prevent complications. - Schedule follow-up appointment in three months to evaluate treatment efficacy and adjust dosage if necessary.  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 Symptoms include aching and throbbing pain, but not the primary  cause of burning sensations. Current symptoms from arthritis are relatively low compared to neuropathy. - Monitor arthritis symptoms and reassess if neuropathy treatment is ineffective.      Return in about 3 months (around 07/17/2024) for Follow-up  neuropathy after gabapentin treatment.

## 2024-04-16 NOTE — Patient Instructions (Signed)
  VISIT SUMMARY: Today, you were seen for severe foot pain, particularly in your heels, back of the feet, and toes. We discussed your symptoms, including the burning and aching pain that affects your daily activities, such as driving and walking. We also reviewed your current diabetes management and family history of foot issues.  YOUR PLAN: -DIABETIC POLYNEUROPATHY: Diabetic polyneuropathy is a type of nerve damage caused by diabetes, leading to pain, burning, and decreased sensation in your feet. We have prescribed gabapentin 300 mg to be taken at night to help manage your pain. You may need to increase the dosage based on your response and any side effects. We will also check your vitamin B12 and folate levels with lab tests. It's important to inspect your feet daily and avoid walking barefoot to prevent injuries. Keeping your blood sugar under control is crucial to prevent further complications. We will follow up in three months to see how the treatment is working and adjust the dosage if needed.  -ARTHRITIS OF LEFT GREAT TOE AND MIDTARSAL JOINTS: Arthritis in your left great toe and midtarsal joints is causing aching and throbbing pain. While this is not the main cause of your burning sensations, we will monitor these symptoms and reassess if the neuropathy treatment does not provide sufficient relief.  INSTRUCTIONS: Please get your vitamin B12 and folate levels checked at [facility]. We will have a follow-up appointment in three months to evaluate the effectiveness of your treatment and make any necessary adjustments.                      Contains text generated by [employer]bridge.                                 Contains text generated by Abridge.

## 2024-04-24 DIAGNOSIS — E1142 Type 2 diabetes mellitus with diabetic polyneuropathy: Secondary | ICD-10-CM | POA: Diagnosis not present

## 2024-04-25 LAB — FOLATE: Folate: 16.6 ng/mL (ref >3.0)

## 2024-04-25 LAB — VITAMIN B12: Vitamin B-12: 652 pg/mL (ref 232–1245)

## 2024-05-14 ENCOUNTER — Ambulatory Visit: Payer: Self-pay | Admitting: Podiatry

## 2024-06-19 ENCOUNTER — Other Ambulatory Visit (HOSPITAL_COMMUNITY): Payer: Self-pay | Admitting: Urology

## 2024-06-19 DIAGNOSIS — C61 Malignant neoplasm of prostate: Secondary | ICD-10-CM

## 2024-06-19 DIAGNOSIS — R9721 Rising PSA following treatment for malignant neoplasm of prostate: Secondary | ICD-10-CM

## 2024-07-08 ENCOUNTER — Other Ambulatory Visit: Payer: Self-pay | Admitting: Podiatry

## 2024-08-09 DIAGNOSIS — E7800 Pure hypercholesterolemia, unspecified: Secondary | ICD-10-CM | POA: Diagnosis not present

## 2024-08-09 DIAGNOSIS — Z0000 Encounter for general adult medical examination without abnormal findings: Secondary | ICD-10-CM | POA: Diagnosis not present

## 2024-08-09 DIAGNOSIS — C61 Malignant neoplasm of prostate: Secondary | ICD-10-CM | POA: Diagnosis not present

## 2024-08-09 DIAGNOSIS — E1169 Type 2 diabetes mellitus with other specified complication: Secondary | ICD-10-CM | POA: Diagnosis not present

## 2024-08-15 DIAGNOSIS — G4733 Obstructive sleep apnea (adult) (pediatric): Secondary | ICD-10-CM | POA: Diagnosis not present

## 2024-08-15 DIAGNOSIS — E7800 Pure hypercholesterolemia, unspecified: Secondary | ICD-10-CM | POA: Diagnosis not present

## 2024-08-15 DIAGNOSIS — K219 Gastro-esophageal reflux disease without esophagitis: Secondary | ICD-10-CM | POA: Diagnosis not present

## 2024-08-15 DIAGNOSIS — G629 Polyneuropathy, unspecified: Secondary | ICD-10-CM | POA: Diagnosis not present

## 2024-08-15 DIAGNOSIS — J45998 Other asthma: Secondary | ICD-10-CM | POA: Diagnosis not present

## 2024-08-15 DIAGNOSIS — Z0000 Encounter for general adult medical examination without abnormal findings: Secondary | ICD-10-CM | POA: Diagnosis not present

## 2024-08-15 DIAGNOSIS — Z6833 Body mass index (BMI) 33.0-33.9, adult: Secondary | ICD-10-CM | POA: Diagnosis not present

## 2024-08-15 DIAGNOSIS — F419 Anxiety disorder, unspecified: Secondary | ICD-10-CM | POA: Diagnosis not present

## 2024-08-15 DIAGNOSIS — K5900 Constipation, unspecified: Secondary | ICD-10-CM | POA: Diagnosis not present

## 2024-08-15 DIAGNOSIS — C61 Malignant neoplasm of prostate: Secondary | ICD-10-CM | POA: Diagnosis not present

## 2024-08-15 DIAGNOSIS — E1169 Type 2 diabetes mellitus with other specified complication: Secondary | ICD-10-CM | POA: Diagnosis not present

## 2024-08-15 DIAGNOSIS — G4700 Insomnia, unspecified: Secondary | ICD-10-CM | POA: Diagnosis not present

## 2024-08-20 ENCOUNTER — Encounter: Payer: Self-pay | Admitting: Radiology

## 2024-08-23 DIAGNOSIS — G4733 Obstructive sleep apnea (adult) (pediatric): Secondary | ICD-10-CM | POA: Diagnosis not present

## 2024-08-23 DIAGNOSIS — J343 Hypertrophy of nasal turbinates: Secondary | ICD-10-CM | POA: Diagnosis not present

## 2024-08-23 DIAGNOSIS — Z6833 Body mass index (BMI) 33.0-33.9, adult: Secondary | ICD-10-CM | POA: Diagnosis not present

## 2024-08-30 ENCOUNTER — Ambulatory Visit: Admitting: Podiatry

## 2024-08-30 VITALS — Ht 73.0 in | Wt 258.0 lb

## 2024-08-30 DIAGNOSIS — M79674 Pain in right toe(s): Secondary | ICD-10-CM | POA: Diagnosis not present

## 2024-08-30 DIAGNOSIS — M79675 Pain in left toe(s): Secondary | ICD-10-CM | POA: Diagnosis not present

## 2024-08-30 DIAGNOSIS — B351 Tinea unguium: Secondary | ICD-10-CM | POA: Diagnosis not present

## 2024-08-30 DIAGNOSIS — E1142 Type 2 diabetes mellitus with diabetic polyneuropathy: Secondary | ICD-10-CM

## 2024-08-30 MED ORDER — PREGABALIN 75 MG PO CAPS: 75.0000 mg | ORAL_CAPSULE | Freq: Two times a day (BID) | ORAL | 2 refills | Status: AC

## 2024-09-02 NOTE — Progress Notes (Signed)
  Subjective:  Patient ID: Randy Alvarez, male    DOB: 1956/09/12,  MRN: [id number]  Chief Complaint  Patient presents with   Diabetes    Rm 17 Patient is here for [facility] and would like to discuss alternative medicine from gabapentin .    Discussed the use of AI scribe software for clinical note transcription with the patient, who gave verbal consent to proceed.  History of Present Illness Candon Caras is a 68 year old male with diabetes who presents with severe foot pain.  He also complains of painful elongated thickened nails on both feet that are difficult to cut. He has had no relief due to the Gabapentin  and had to discontinue it due to side effects with dizziness/balance issues.       Objective:    Physical Exam VASCULAR: DP and PT pulse palpable. Foot is warm and well-perfused. Capillary fill time is brisk. DERMATOLOGIC: Normal skin turgor, texture, and temperature. No open lesions, rashes, or ulcerations. Thickened elongated nails x10 with mycotic subungual debris NEUROLOGIC: Lack of monofilament appreciation to the mid lower leg. Vibratory sensation intact. Normal sensation to light touch and pressure. No paresthesias. ORTHOPEDIC: Hallux valgus deformity with good range of motion of the MTP, midtarsal, and ankle joints.  No pain with manipulation or worsening pain with manipulation of the MTP midfoot or plantar fascia to indicate this is the primary issue   No images are attached to the encounter.    Results RADIOLOGY Foot X-ray: Mild midtarsal arthritis bilaterally, moderate to severe arthritis of the first MTP joint on the left great toe (04/16/2024)   Assessment:   1. Pain due to onychomycosis of toenails of both feet   2. Diabetic peripheral neuropathy (HCC)      Plan:  Patient was evaluated and treated and all questions answered.  Assessment and Plan Assessment & Plan Diabetic polyneuropathy No improvement with gabapentin . Discussed switch to  lyrica and possible side effects and dosing. Rx sent to pharmacy he will let me know how it works for him  Discussed the etiology and treatment options for the condition in detail with the patient. Recommended debridement of the nails today. Sharp and mechanical debridement performed of all painful and mycotic nails today. Nails debrided in length and thickness using a nail nipper to level of comfort. Follow up as needed for painful nails.       No follow-ups on file.

## 2024-09-03 ENCOUNTER — Telehealth: Payer: Self-pay | Admitting: Lab

## 2024-09-03 ENCOUNTER — Other Ambulatory Visit: Payer: Self-pay | Admitting: Lab

## 2024-09-03 NOTE — Telephone Encounter (Signed)
 Refill request for Gabapentin  300 mg please review.

## 2024-09-11 ENCOUNTER — Other Ambulatory Visit: Payer: Self-pay | Admitting: Lab

## 2024-09-11 MED ORDER — GABAPENTIN 300 MG PO CAPS: 300.0000 mg | ORAL_CAPSULE | Freq: Two times a day (BID) | ORAL | 0 refills | Status: DC

## 2024-09-24 DIAGNOSIS — J0101 Acute recurrent maxillary sinusitis: Secondary | ICD-10-CM | POA: Diagnosis not present

## 2024-10-09 ENCOUNTER — Other Ambulatory Visit: Payer: Self-pay | Admitting: Podiatry

## 2024-11-19 ENCOUNTER — Ambulatory Visit (HOSPITAL_COMMUNITY): Admission: RE | Admit: 2024-11-19 | Source: Ambulatory Visit

## 2024-11-22 ENCOUNTER — Inpatient Hospital Stay (HOSPITAL_COMMUNITY): Admission: RE | Admit: 2024-11-22

## 2024-11-22 DIAGNOSIS — R9721 Rising PSA following treatment for malignant neoplasm of prostate: Secondary | ICD-10-CM

## 2024-11-22 DIAGNOSIS — C61 Malignant neoplasm of prostate: Secondary | ICD-10-CM

## 2024-11-22 MED ORDER — FLOTUFOLASTAT F 18 GALLIUM 296-5846 MBQ/ML IV SOLN
8.0000 | Freq: Once | INTRAVENOUS | Status: AC
  Administered 2024-11-22: 8.14 via INTRAVENOUS
  Filled 2024-11-22: qty 8

## 2024-12-04 ENCOUNTER — Ambulatory Visit (HOSPITAL_BASED_OUTPATIENT_CLINIC_OR_DEPARTMENT_OTHER): Admit: 2024-12-04 | Admitting: Otolaryngology

## 2024-12-04 ENCOUNTER — Encounter (HOSPITAL_BASED_OUTPATIENT_CLINIC_OR_DEPARTMENT_OTHER): Admission: RE | Payer: Self-pay | Source: Home / Self Care

## 2025-02-28 ENCOUNTER — Ambulatory Visit: Admitting: Podiatry
